# Patient Record
Sex: Female | Born: 1985 | Race: White | Hispanic: No | Marital: Married | Smoking: Never smoker
Health system: Southern US, Community
[De-identification: ages and names within clinical notes are randomized; demographics above are authoritative.]

## PROBLEM LIST (undated history)

## (undated) DIAGNOSIS — E559 Vitamin D deficiency, unspecified: Secondary | ICD-10-CM

## (undated) DIAGNOSIS — F32A Depression, unspecified: Secondary | ICD-10-CM

## (undated) DIAGNOSIS — F419 Anxiety disorder, unspecified: Secondary | ICD-10-CM

## (undated) DIAGNOSIS — F329 Major depressive disorder, single episode, unspecified: Secondary | ICD-10-CM

## (undated) DIAGNOSIS — E079 Disorder of thyroid, unspecified: Secondary | ICD-10-CM

## (undated) HISTORY — PX: NO PAST SURGERIES: SHX2092

## (undated) HISTORY — DX: Anxiety disorder, unspecified: F41.9

## (undated) HISTORY — DX: Major depressive disorder, single episode, unspecified: F32.9

## (undated) HISTORY — DX: Depression, unspecified: F32.A

---

## 2013-06-01 ENCOUNTER — Ambulatory Visit: Payer: Self-pay | Admitting: Emergency Medicine

## 2013-06-01 LAB — URINALYSIS, COMPLETE
BILIRUBIN, UR: NEGATIVE
GLUCOSE, UR: NEGATIVE mg/dL (ref 0–75)
Ketone: NEGATIVE
LEUKOCYTE ESTERASE: NEGATIVE
NITRITE: NEGATIVE
Ph: 7 (ref 4.5–8.0)
Specific Gravity: 1.01 (ref 1.003–1.030)
WBC UR: NONE SEEN /HPF (ref 0–5)

## 2013-06-01 LAB — RAPID INFLUENZA A&B ANTIGENS

## 2013-06-01 LAB — PREGNANCY, URINE: Pregnancy Test, Urine: NEGATIVE m[IU]/mL

## 2014-01-15 ENCOUNTER — Ambulatory Visit: Payer: Self-pay | Admitting: Obstetrics and Gynecology

## 2014-02-11 ENCOUNTER — Ambulatory Visit: Payer: Self-pay | Admitting: Obstetrics and Gynecology

## 2014-02-19 ENCOUNTER — Observation Stay: Payer: Self-pay | Admitting: Emergency Medicine

## 2014-02-19 LAB — TROPONIN I: Troponin-I: <0.02 ng/mL

## 2014-02-19 LAB — COMPREHENSIVE METABOLIC PANEL
ALBUMIN: 2.5 g/dL — AB (ref 3.4–5.0)
ALT: 16 U/L
Alkaline Phosphatase: 193 U/L — ABNORMAL HIGH
Anion Gap: 9 (ref 7–16)
BILIRUBIN TOTAL: 0.3 mg/dL (ref 0.2–1.0)
BUN: 9 mg/dL (ref 7–18)
CALCIUM: 8.6 mg/dL (ref 8.5–10.1)
Chloride: 107 mmol/L (ref 98–107)
Co2: 23 mmol/L (ref 21–32)
Creatinine: 0.5 mg/dL — ABNORMAL LOW (ref 0.60–1.30)
EGFR (African American): >60
GLUCOSE: 80 mg/dL (ref 65–99)
OSMOLALITY: 275 (ref 275–301)
Potassium: 4 mmol/L (ref 3.5–5.1)
SGOT(AST): 15 U/L (ref 15–37)
Sodium: 139 mmol/L (ref 136–145)
Total Protein: 6.5 g/dL (ref 6.4–8.2)

## 2014-02-19 LAB — CBC
HCT: 38.2 % (ref 35.0–47.0)
HGB: 12.7 g/dL (ref 12.0–16.0)
MCH: 29 pg (ref 26.0–34.0)
MCHC: 33.2 g/dL (ref 32.0–36.0)
MCV: 87 fL (ref 80–100)
Platelet: 289 10*3/uL (ref 150–440)
RBC: 4.37 10*6/uL (ref 3.80–5.20)
RDW: 13.9 % (ref 11.5–14.5)
WBC: 9.9 10*3/uL (ref 3.6–11.0)

## 2014-03-12 ENCOUNTER — Inpatient Hospital Stay: Payer: Self-pay

## 2014-03-12 LAB — CBC WITH DIFFERENTIAL/PLATELET
BASOS ABS: 0 10*3/uL (ref 0.0–0.1)
Basophil %: 0.4 %
Eosinophil #: 0.2 10*3/uL (ref 0.0–0.7)
Eosinophil %: 2.2 %
HCT: 38.3 % (ref 35.0–47.0)
HGB: 12.6 g/dL (ref 12.0–16.0)
LYMPHS ABS: 1.7 10*3/uL (ref 1.0–3.6)
Lymphocyte %: 18.1 %
MCH: 28.3 pg (ref 26.0–34.0)
MCHC: 32.8 g/dL (ref 32.0–36.0)
MCV: 86 fL (ref 80–100)
Monocyte #: 0.6 x10 3/mm (ref 0.2–0.9)
Monocyte %: 6.1 %
Neutrophil #: 7 10*3/uL — ABNORMAL HIGH (ref 1.4–6.5)
Neutrophil %: 73.2 %
Platelet: 296 10*3/uL (ref 150–440)
RBC: 4.45 10*6/uL (ref 3.80–5.20)
RDW: 14.3 % (ref 11.5–14.5)
WBC: 9.5 10*3/uL (ref 3.6–11.0)

## 2014-03-13 DIAGNOSIS — O24419 Gestational diabetes mellitus in pregnancy, unspecified control: Secondary | ICD-10-CM

## 2014-03-13 LAB — CBC WITH DIFFERENTIAL/PLATELET
BASOS PCT: 0.2 %
Basophil #: 0 10*3/uL (ref 0.0–0.1)
Eosinophil #: 0.1 10*3/uL (ref 0.0–0.7)
Eosinophil %: 1 %
HCT: 38.5 % (ref 35.0–47.0)
HGB: 12.4 g/dL (ref 12.0–16.0)
Lymphocyte #: 1.8 10*3/uL (ref 1.0–3.6)
Lymphocyte %: 13.7 %
MCH: 27.7 pg (ref 26.0–34.0)
MCHC: 32.1 g/dL (ref 32.0–36.0)
MCV: 86 fL (ref 80–100)
MONO ABS: 0.8 x10 3/mm (ref 0.2–0.9)
Monocyte %: 6.6 %
Neutrophil #: 10.1 10*3/uL — ABNORMAL HIGH (ref 1.4–6.5)
Neutrophil %: 78.5 %
Platelet: 285 10*3/uL (ref 150–440)
RBC: 4.46 10*6/uL (ref 3.80–5.20)
RDW: 14.5 % (ref 11.5–14.5)
WBC: 12.8 10*3/uL — ABNORMAL HIGH (ref 3.6–11.0)

## 2014-03-15 LAB — HEMATOCRIT: HCT: 31.3 % — ABNORMAL LOW (ref 35.0–47.0)

## 2014-08-09 ENCOUNTER — Ambulatory Visit
Admit: 2014-08-09 | Disposition: A | Discharge status: Home / Self Care | Payer: Self-pay | Attending: Family Medicine | Admitting: Family Medicine

## 2014-08-09 LAB — RAPID STREP-A WITH REFLX: MICRO TEXT REPORT: NEGATIVE

## 2014-08-12 LAB — BETA STREP CULTURE(ARMC)

## 2014-08-21 NOTE — H&P (Signed)
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   Patient's Medical History Thyroid Disease   Patient's Surgical History none   Medications Pre Natal Vitamins  Tylenol (Acetaminophen)  levothyroxine 50mcg, vit D 2000 international unit   Allergies Sulfa, other, Ceclor   Social History none   Family History Non-Contributory   ROS:  ROS All systems were reviewed.  HEENT, CNS, GI, GU, Respiratory, CV, Renal and Musculoskeletal systems were found to be normal.   Exam:  Vital Signs stable   Urine Protein not completed   General no apparent distress   Mental Status clear   Chest clear   Heart normal sinus rhythm   Abdomen gravid, non-tender   Estimated Fetal Weight Average for gestational age   Fetal Position vtx   Back no CVAT   Edema no edema   Pelvic ft/50/-2   Mebranes Intact   FHT normal rate with no decels   Fetal Heart Rate 145   Ucx absent   Skin dry   Impression:  Impression IOL for GDM at 8129w4d   Plan:  Plan EFM/NST, Cytotec IOL at term   Electronic Signatures: Ulyses AmorBurr, Rinda Rollyson N (CNM)  (Signed [phone]-Nov-15 08:56)  Authored: L&D Evaluation   Last Updated: 30-Nov-15 08:56 by Ulyses AmorBurr, Lilas Diefendorf N (CNM)

## 2015-04-17 ENCOUNTER — Encounter: Payer: Self-pay | Admitting: Gynecology

## 2015-04-17 ENCOUNTER — Ambulatory Visit
Admission: EM | Admit: 2015-04-17 | Discharge: 2015-04-17 | Disposition: A | Discharge status: Home / Self Care | Attending: Family Medicine | Admitting: Family Medicine

## 2015-04-17 DIAGNOSIS — L309 Dermatitis, unspecified: Secondary | ICD-10-CM | POA: Diagnosis not present

## 2015-04-17 HISTORY — DX: Disorder of thyroid, unspecified: E07.9

## 2015-04-17 HISTORY — DX: Vitamin D deficiency, unspecified: E55.9

## 2015-04-17 MED ORDER — TRIAMCINOLONE ACETONIDE 0.1 % EX CREA: 1.0000 "application " | TOPICAL_CREAM | Freq: Two times a day (BID) | CUTANEOUS | Status: DC

## 2015-04-17 NOTE — ED Provider Notes (Signed)
CSN: [id number]     Arrival date & time 04/17/15  1129 History   First MD Initiated Contact with Patient 04/17/15 1309     Chief Complaint  Patient presents with  . Poison Ivy   (Consider location/radiation/quality/duration/timing/severity/associated sxs/prior Treatment) HPI Comments: 30 yo female presents with a 2 weeks h/o a itchy rash on her legs and arms. Denies any pain, burning, fevers, chills, swelling, chest pains, shortness of breath,  blisters, drainage, new soaps, detergents, lotions, cosmetics, medications, pets, plants. States last summer had an exposure to poison ivy, developed a poison ivy rash, and wondering if this is related.  The history is provided by the patient.    Past Medical History  Diagnosis Date  . Thyroid disease   . Vitamin D deficiency    History reviewed. No pertinent past surgical history. No family history on file. Social History  Substance Use Topics  . Smoking status: Never Smoker   . Smokeless tobacco: None  . Alcohol Use: Yes   OB History    No data available     Review of Systems  Allergies  Ceclor and Sulfa antibiotics  Home Medications   Prior to Admission medications   Medication Sig Start Date End Date Taking? Authorizing Provider  Cholecalciferol (VITAMIN D) 2000 units tablet Take 2,000 Units by mouth daily.   Yes Historical Provider, MD  levothyroxine (SYNTHROID, LEVOTHROID) 50 MCG tablet Take 50 mcg by mouth daily before breakfast.   Yes Historical Provider, MD  norgestimate-ethinyl estradiol (ORTHO-CYCLEN,SPRINTEC,PREVIFEM) 0.25-35 MG-MCG tablet Take 1 tablet by mouth daily.   Yes Historical Provider, MD  triamcinolone cream (KENALOG) 0.1 % Apply 1 application topically 2 (two) times daily. 04/17/15   Payton Mccallumrlando Jaymen Fetch, MD   Meds Ordered and Administered this Visit  Medications - No data to display  BP 110/65 mmHg  Pulse 70  Temp(Src) 98.1 F (36.7 C) (Oral)  Resp 16  Ht 5\' 5"  (1.651 m)  Wt 174 lb (78.926 kg)  BMI 28.96  kg/m2  SpO2 100%  LMP 02/15/2015 (Approximate) No data found.   Physical Exam  Constitutional: She appears well-developed and well-nourished. No distress.  Skin: Rash noted. She is not diaphoretic.  Scaly, erythematous rash to legs and arms, mainly flexural areas; no vesicular lesions or drainage  Nursing note and vitals reviewed.   ED Course  Procedures (including critical care time)  Labs Review Labs Reviewed - No data to display  Imaging Review No results found.   Visual Acuity Review  Right Eye Distance:   Left Eye Distance:   Bilateral Distance:    Right Eye Near:   Left Eye Near:    Bilateral Near:         MDM   1. Eczema    Discharge Medication List as of 04/17/2015  1:36 PM    START taking these medications   Details  triamcinolone cream (KENALOG) 0.1 % Apply 1 application topically 2 (two) times daily., Starting 04/17/2015, Until Discontinued, Normal       1. diagnosis reviewed with patient 2. rx as per orders above; reviewed possible side effects, interactions, risks and benefits  3. Recommend supportive treatment with increased water intake, moisturizing lotion 4. Follow-up prn if symptoms worsen or don't improve    Payton Mccallumrlando Jerral Mccauley, MD 05/08/15 1332

## 2015-04-17 NOTE — ED Notes (Signed)
Patient c/o rash bilateral arms and legs x 2 weeks.

## 2015-07-29 ENCOUNTER — Encounter: Payer: Self-pay | Admitting: *Deleted

## 2015-07-30 ENCOUNTER — Other Ambulatory Visit: Payer: Self-pay | Admitting: Obstetrics and Gynecology

## 2015-07-30 ENCOUNTER — Encounter: Payer: Self-pay | Admitting: Obstetrics and Gynecology

## 2015-07-30 ENCOUNTER — Ambulatory Visit (INDEPENDENT_AMBULATORY_CARE_PROVIDER_SITE_OTHER): Admitting: Obstetrics and Gynecology

## 2015-07-30 VITALS — BP 115/69 | HR 82 | Ht 65.0 in | Wt 180.9 lb

## 2015-07-30 DIAGNOSIS — Z01419 Encounter for gynecological examination (general) (routine) without abnormal findings: Secondary | ICD-10-CM

## 2015-07-30 DIAGNOSIS — E669 Obesity, unspecified: Secondary | ICD-10-CM | POA: Diagnosis not present

## 2015-07-30 NOTE — Progress Notes (Signed)
Subjective:   Daisy Hancock is a 30 y.o. 702P0011 Caucasian female here for a routine well-woman exam.  No LMP recorded. Patient is not currently having periods (Reason: Oral contraceptives).    Current complaints: inability to lose weight PCP: me       does desire labs  Social History: Sexual: heterosexual Marital Status: married Living situation: with family Occupation: homemaker Tobacco/alcohol: no tobacco use Illicit drugs: no history of illicit drug use  The following portions of the patient's history were reviewed and updated as appropriate: allergies, current medications, past family history, past medical history, past social history, past surgical history and problem list.  Past Medical History Past Medical History  Diagnosis Date  . Thyroid disease   . Vitamin D deficiency   . Anxiety   . Depression     Past Surgical History History reviewed. No pertinent past surgical history.  Gynecologic History G2P0011  No LMP recorded. Patient is not currently having periods (Reason: Oral contraceptives). Contraception: OCP (estrogen/progesterone) Last Pap: 2014. Results were: normal   Obstetric History OB History  Gravida Para Term Preterm AB SAB TAB Ectopic Multiple Living  2    1 1    1     # Outcome Date GA Lbr Len/2nd Weight Sex Delivery Anes PTL Lv  2 Gravida 03/13/14    F Vag-Spont   Y  1 SAB               Current Medications Current Outpatient Prescriptions on File Prior to Visit  Medication Sig Dispense Refill  . Cholecalciferol (VITAMIN D) 2000 units tablet Take 2,000 Units by mouth daily.    Marland Kitchen. levothyroxine (SYNTHROID, LEVOTHROID) 50 MCG tablet Take 50 mcg by mouth daily before breakfast.    . norgestimate-ethinyl estradiol (ORTHO-CYCLEN,SPRINTEC,PREVIFEM) 0.25-35 MG-MCG tablet Take 1 tablet by mouth daily.    Marland Kitchen. triamcinolone cream (KENALOG) 0.1 % Apply 1 application topically 2 (two) times daily. (Patient not taking: Reported on 07/30/2015) 30 g 0    No current facility-administered medications on file prior to visit.    Review of Systems Patient denies any headaches, blurred vision, shortness of breath, chest pain, abdominal pain, problems with bowel movements, urination, or intercourse.  Objective:  BP 115/69 mmHg  Pulse 82  Ht 5\' 5"  (1.651 m)  Wt 180 lb 14.4 oz (82.056 kg)  BMI 30.10 kg/m2 Physical Exam  General:  Well developed, well nourished, no acute distress. She is alert and oriented x3. Skin:  Warm and dry Neck:  Midline trachea, no thyromegaly or nodules Cardiovascular: Regular rate and rhythm, no murmur heard Lungs:  Effort normal, all lung fields clear to auscultation bilaterally Breasts:  No dominant palpable mass, retraction, or nipple discharge Abdomen:  Soft, non tender, no hepatosplenomegaly or masses Pelvic:  External genitalia is normal in appearance.  The vagina is normal in appearance. The cervix is bulbous, no CMT.  Thin prep pap is done with HR HPV cotesting. Uterus is felt to be normal size, shape, and contour.  No adnexal masses or tenderness noted. Extremities:  No swelling or varicosities noted Psych:  She has a normal mood and affect  Assessment:   Healthy well-woman exam Obesity OCP user  Plan:   F/U 1 year for AE, or sooner if needed  Colie Josten Suzan NailerN Kelani Robart, CNM

## 2015-07-30 NOTE — Patient Instructions (Signed)
  Place annual gynecologic exam patient instructions here.  Thank you for enrolling in MyChart. Please follow the instructions below to securely access your online medical record. MyChart allows you to send messages to your doctor, view your test results, manage appointments, and more.   How Do I Sign Up? 1. In your Internet browser, go to [employer] Address [employer] and enter [email or web address]. 2. Click on the Sign Up Now link in the Sign In box. You will see the New Member Sign Up page. 3. Enter your MyChart Access Code exactly as it appears below. You will not need to use this code after you've completed the sign-up process. If you do not sign up before the expiration date, you must request a new code.  MyChart Access Code: [email or web address] Expires: 09/28/2015  2:04 PM  4. Enter your Social Security Number ([id number]) and Date of Birth (mm/dd/yyyy) as indicated and click Submit. You will be taken to the next sign-up page. 5. Create a MyChart ID. This will be your MyChart login ID and cannot be changed, so think of one that is secure and easy to remember. 6. Create a MyChart password. You can change your password at any time. 7. Enter your Password Reset Question and Answer. This can be used at a later time if you forget your password.  8. Enter your e-mail address. You will receive e-mail notification when new information is available in MyChart. 9. Click Sign Up. You can now view your medical record.   Additional Information Remember, MyChart is NOT to be used for urgent needs. For medical emergencies, dial 911.

## 2015-07-31 LAB — COMPREHENSIVE METABOLIC PANEL WITH GFR
ALT: 26 IU/L (ref 0–32)
AST: 15 IU/L (ref 0–40)
Albumin/Globulin Ratio: 1.8 (ref 1.2–2.2)
Albumin: 4.4 g/dL (ref 3.5–5.5)
Alkaline Phosphatase: 57 IU/L (ref 39–117)
BUN/Creatinine Ratio: 16 (ref 9–23)
BUN: 11 mg/dL (ref 6–20)
Bilirubin Total: 0.2 mg/dL (ref 0.0–1.2)
CO2: 22 mmol/L (ref 18–29)
Calcium: 9 mg/dL (ref 8.7–10.2)
Chloride: 100 mmol/L (ref 96–106)
Creatinine, Ser: 0.67 mg/dL (ref 0.57–1.00)
GFR calc Af Amer: 136 mL/min/1.73
GFR calc non Af Amer: 118 mL/min/1.73
Globulin, Total: 2.5 g/dL (ref 1.5–4.5)
Glucose: 79 mg/dL (ref 65–99)
Potassium: 4.1 mmol/L (ref 3.5–5.2)
Sodium: 140 mmol/L (ref 134–144)
Total Protein: 6.9 g/dL (ref 6.0–8.5)

## 2015-07-31 LAB — LIPID PANEL
CHOL/HDL RATIO: 3.7 ratio (ref 0.0–4.4)
Cholesterol, Total: 122 mg/dL (ref 100–199)
HDL: 33 mg/dL — ABNORMAL LOW (ref >39)
LDL Calculated: 48 mg/dL (ref 0–99)
Triglycerides: 205 mg/dL — ABNORMAL HIGH (ref 0–149)
VLDL CHOLESTEROL CAL: 41 mg/dL — AB (ref 5–40)

## 2015-07-31 LAB — THYROID PANEL WITH TSH
Free Thyroxine Index: 3 (ref 1.2–4.9)
T3 Uptake Ratio: 23 % — ABNORMAL LOW (ref 24–39)
T4, Total: 13.1 ug/dL — ABNORMAL HIGH (ref 4.5–12.0)
TSH: 1.39 u[IU]/mL (ref 0.450–4.500)

## 2015-07-31 LAB — CYTOLOGY - PAP

## 2015-08-08 ENCOUNTER — Ambulatory Visit (INDEPENDENT_AMBULATORY_CARE_PROVIDER_SITE_OTHER): Admitting: Obstetrics and Gynecology

## 2015-08-08 VITALS — BP 113/75 | HR 93 | Wt 178.0 lb

## 2015-08-08 DIAGNOSIS — E669 Obesity, unspecified: Secondary | ICD-10-CM | POA: Diagnosis not present

## 2015-08-08 MED ORDER — CYANOCOBALAMIN 1000 MCG/ML IJ SOLN
1000.0000 ug | Freq: Once | INTRAMUSCULAR | Status: AC
  Administered 2015-08-08: 1000 ug via INTRAMUSCULAR

## 2015-08-08 NOTE — Progress Notes (Signed)
Patient ID: Daisy Hancock, female   DOB: 10/01/1985, 30 y.o.   MRN: [id number] Pt presents for weight, B/P, B-12 injection. Pt picked up prescription today for phentermine and B-12 medication. Wt. 178 lbs.

## 2015-08-09 ENCOUNTER — Telehealth: Payer: Self-pay | Admitting: *Deleted

## 2015-08-09 NOTE — Telephone Encounter (Signed)
Mailed all info to pt 

## 2015-08-09 NOTE — Telephone Encounter (Signed)
-----   Message from Purcell NailsMelody N Shambley, [place] sent at 08/06/2015  3:40 PM EDT ----- Pleas let her know labs look stable, to continue to work on low cholesterol diet. And we will recheck in 1 year

## 2015-09-05 ENCOUNTER — Ambulatory Visit (INDEPENDENT_AMBULATORY_CARE_PROVIDER_SITE_OTHER): Admitting: Obstetrics and Gynecology

## 2015-09-05 VITALS — BP 99/63 | HR 81 | Ht 65.0 in | Wt 174.6 lb

## 2015-09-05 DIAGNOSIS — E663 Overweight: Secondary | ICD-10-CM | POA: Diagnosis not present

## 2015-09-05 DIAGNOSIS — R5383 Other fatigue: Secondary | ICD-10-CM | POA: Diagnosis not present

## 2015-09-05 MED ORDER — CYANOCOBALAMIN 1000 MCG/ML IJ SOLN
1000.0000 ug | Freq: Once | INTRAMUSCULAR | Status: AC
  Administered 2015-09-05: 1000 ug via INTRAMUSCULAR

## 2015-09-05 NOTE — Progress Notes (Signed)
Filed Weights   09/05/15 1449  Weight: 174 lb 9.6 oz (79.198 kg)     Pt presents for weight management, pt today with 4lbs weight loss since visit last month. Pt denies adverse reaction to Adipex. Gave pt verbal encouragement. Pt to f/u in 1 month.

## 2015-10-02 ENCOUNTER — Ambulatory Visit (INDEPENDENT_AMBULATORY_CARE_PROVIDER_SITE_OTHER): Admitting: Obstetrics and Gynecology

## 2015-10-02 VITALS — BP 110/67 | HR 85 | Wt 167.6 lb

## 2015-10-02 DIAGNOSIS — E669 Obesity, unspecified: Secondary | ICD-10-CM | POA: Diagnosis not present

## 2015-10-02 MED ORDER — CYANOCOBALAMIN 1000 MCG/ML IJ SOLN
1000.0000 ug | Freq: Once | INTRAMUSCULAR | Status: AC
  Administered 2015-10-02: 1000 ug via INTRAMUSCULAR

## 2015-10-02 NOTE — Progress Notes (Signed)
Patient ID: Daisy Hancock, female   DOB: 10/24/1985, 30 y.o.   MRN: [id number] Pt presents for weight, B/P, B-12 injection. No side effects of medication-Phentermine, or B-12.  Weight loss of 7 lbs. Encouraged eating healthy and exercise.

## 2015-10-03 ENCOUNTER — Ambulatory Visit

## 2015-10-08 ENCOUNTER — Ambulatory Visit (INDEPENDENT_AMBULATORY_CARE_PROVIDER_SITE_OTHER): Admitting: Obstetrics and Gynecology

## 2015-10-08 ENCOUNTER — Encounter: Payer: Self-pay | Admitting: Obstetrics and Gynecology

## 2015-10-08 VITALS — BP 121/82 | HR 102 | Ht 65.0 in | Wt 165.6 lb

## 2015-10-08 DIAGNOSIS — N921 Excessive and frequent menstruation with irregular cycle: Secondary | ICD-10-CM

## 2015-10-08 DIAGNOSIS — N926 Irregular menstruation, unspecified: Secondary | ICD-10-CM

## 2015-10-08 LAB — POCT URINE PREGNANCY: PREG TEST UR: NEGATIVE

## 2015-10-08 NOTE — Progress Notes (Signed)
Subjective:     Patient ID: Daisy Hancock, female   DOB: 04/06/1986, 30 y.o.   MRN: [id number]  HPI Reports onset of spotting in middle of pack of of OCPs for 3 days, then became heavy with cramping and mucus/tissue in with blood times 7 days. Resolved and then menses started as normal in placebo week. Denies forgetting or taking any pills late, or any other symptoms. Has lost 23# in last 10 weeks on adipex and B12.  Review of Systems See above    Objective:   Physical Exam A&O x4  well groomed female in no distress Blood pressure 121/82, pulse 102, height 5\' 5"  (1.651 m), weight 165 lb 9.6 oz (75.116 kg), last menstrual period 10/07/2015, not currently breastfeeding. UPT- Pelvic exam: normal external genitalia, vulva, vagina, cervix, uterus and adnexa.    Assessment:     BTB on OCP, secondary to rapid weight loss     Plan:     Will continue with current medications, OK to double up on pills for 3 days if bleeding reoccurs this month or in future. Patient reassured.  Keilany Burnette BenldShambley, CNM

## 2015-11-06 ENCOUNTER — Encounter: Payer: Self-pay | Admitting: Obstetrics and Gynecology

## 2015-11-06 ENCOUNTER — Ambulatory Visit (INDEPENDENT_AMBULATORY_CARE_PROVIDER_SITE_OTHER): Admitting: Obstetrics and Gynecology

## 2015-11-06 VITALS — BP 114/72 | HR 84 | Wt 159.2 lb

## 2015-11-06 DIAGNOSIS — E669 Obesity, unspecified: Secondary | ICD-10-CM

## 2015-11-06 MED ORDER — PHENTERMINE HCL 37.5 MG PO TABS: ORAL_TABLET | ORAL | 2 refills | Status: DC

## 2015-11-06 MED ORDER — CYANOCOBALAMIN 1000 MCG/ML IJ SOLN
1000.0000 ug | Freq: Once | INTRAMUSCULAR | Status: AC
  Administered 2015-11-06: 1000 ug via INTRAMUSCULAR

## 2015-11-06 NOTE — Progress Notes (Signed)
Patient ID: Daisy Hancock, female   DOB: 1985/11/02, 30 y.o.   MRN: [id number] Pt is here for weight management, she is doing very well, denies any s/e Desires refill of Phentermine   08/08/15 wt-178lb 09/05/15 wt-174lb 10/02/15 wt-167lb   Wt-159.2lb BP-114 72 P-84

## 2015-12-18 ENCOUNTER — Ambulatory Visit (INDEPENDENT_AMBULATORY_CARE_PROVIDER_SITE_OTHER): Admitting: Obstetrics and Gynecology

## 2015-12-18 ENCOUNTER — Ambulatory Visit

## 2015-12-18 VITALS — BP 106/71 | HR 94 | Wt 151.5 lb

## 2015-12-18 DIAGNOSIS — E669 Obesity, unspecified: Secondary | ICD-10-CM | POA: Diagnosis not present

## 2015-12-18 MED ORDER — CYANOCOBALAMIN 1000 MCG/ML IJ SOLN
1000.0000 ug | Freq: Once | INTRAMUSCULAR | Status: AC
  Administered 2015-12-18: 1000 ug via INTRAMUSCULAR

## 2015-12-18 NOTE — Progress Notes (Signed)
Patient ID: Daisy Hancock, female   DOB: 03/17/1986, 30 y.o.   MRN: [id number] Pt presents for weight, B/P, B-12 injection. No side effects of medication-Phentermine, or B-12.  Weight loss of _8_ lbs. Encouraged eating healthy and exercise.

## 2016-01-15 ENCOUNTER — Ambulatory Visit (INDEPENDENT_AMBULATORY_CARE_PROVIDER_SITE_OTHER): Admitting: Obstetrics and Gynecology

## 2016-01-15 VITALS — BP 91/72 | HR 105 | Wt 151.0 lb

## 2016-01-15 DIAGNOSIS — E663 Overweight: Secondary | ICD-10-CM

## 2016-01-15 MED ORDER — CYANOCOBALAMIN 1000 MCG/ML IJ SOLN
1000.0000 ug | Freq: Once | INTRAMUSCULAR | Status: AC
  Administered 2016-01-15: 1000 ug via INTRAMUSCULAR

## 2016-01-15 NOTE — Progress Notes (Signed)
Pt is here for wt, bp check, b-1 2 inj Pt denies any s/e, pt states her family has moved in with her parents, sold there house,[place] moving to [place].   12/18/15 wt- 151lb 11/06/15 wt- 159lb

## 2016-01-21 ENCOUNTER — Telehealth: Payer: Self-pay | Admitting: Obstetrics and Gynecology

## 2016-01-21 NOTE — Telephone Encounter (Signed)
Have her put on nurse schedule, as she will will need to fill out forms.

## 2016-01-21 NOTE — Telephone Encounter (Signed)
Pt just found out that her mother has breast ca, she wants to come in for the genetic blood test for breast ca. Let me know if she can come do that or do you need to see her?  She is moving to [place] in a few weeks and would like to do this before she goes. Let me know what I  Need to do and I'll call her.

## 2016-01-24 ENCOUNTER — Other Ambulatory Visit

## 2016-02-06 ENCOUNTER — Telehealth: Payer: Self-pay | Admitting: Obstetrics and Gynecology

## 2016-02-06 NOTE — Telephone Encounter (Signed)
Pt called and stated that she received a message from you wanting her to call you back, she was returning your call.

## 2016-02-06 NOTE — Telephone Encounter (Signed)
Pt aware that insurance will not cover BRAC and wanted to know how much this cost. I informed pt that I would have the  rep. Contact her if she would like and pt agreed that she would like to talk with her. Will talk with rep next week. [place].

## 2016-02-11 ENCOUNTER — Ambulatory Visit (INDEPENDENT_AMBULATORY_CARE_PROVIDER_SITE_OTHER): Admitting: Obstetrics and Gynecology

## 2016-02-11 VITALS — BP 110/73 | HR 80 | Ht 65.0 in | Wt 150.7 lb

## 2016-02-11 DIAGNOSIS — E663 Overweight: Secondary | ICD-10-CM

## 2016-02-11 MED ORDER — CYANOCOBALAMIN 1000 MCG/ML IJ SOLN
1000.0000 ug | Freq: Once | INTRAMUSCULAR | Status: AC
  Administered 2016-02-11: 1000 ug via INTRAMUSCULAR

## 2016-02-11 NOTE — Progress Notes (Signed)
Patient ID: Daisy Hancock, female   DOB: 07/28/1985, 30 y.o.   MRN: [id number]  Pt presents for weight, B/P, B-12 injection. No side effects of medication-Phentermine, or B-12.  Weight loss/gain of  1  lbs. Encouraged eating healthy and exercise. Pt moving to [place]. No f/u appts made.

## 2016-02-12 ENCOUNTER — Telehealth: Payer: Self-pay

## 2016-02-12 NOTE — Telephone Encounter (Signed)
I spoke with pt and informed her that I quoted her the wrong cost for the BRCA test. Instead of $350, the special offer cost $795. Total without special cost is $2400. I informed her that when bill is received [facility] will work with her to decrease bill or set up payments so this is possible for pt.  Pt will talk with her husband and call me back tomorrow and let me know what she would like to do.

## 2016-02-17 ENCOUNTER — Telehealth: Payer: Self-pay | Admitting: Obstetrics and Gynecology

## 2016-02-17 NOTE — Telephone Encounter (Signed)
Called pt fixed the rx

## 2016-02-17 NOTE — Telephone Encounter (Signed)
Called and fixed medication

## 2016-02-17 NOTE — Telephone Encounter (Signed)
Patient called stating the [facility] in [place] wont accept the hand written script for phentermine. She needs you to call the pharmacy the number to [facility] is [phone].

## 2016-02-24 NOTE — Telephone Encounter (Signed)
[facility] CALLED AND THEY NEED CLARIFICATION ON A RX THE MNS SENT IN. NEED THE BRAND NAME, THEY CAN NOT PROCESS IT, PLEASE CALL THE ABOVE NUMBER AN D REFER TO THE REFF # LISTED IN COMMENT NOTE.

## 2016-02-26 NOTE — Telephone Encounter (Signed)
Done-ac 

## 2016-03-03 ENCOUNTER — Other Ambulatory Visit: Payer: Self-pay | Admitting: *Deleted

## 2016-03-03 ENCOUNTER — Telehealth: Payer: Self-pay | Admitting: Obstetrics and Gynecology

## 2016-03-03 MED ORDER — NORGESTIMATE-ETH ESTRADIOL 0.25-35 MG-MCG PO TABS: 1.0000 | ORAL_TABLET | Freq: Every day | ORAL | 4 refills | Status: DC

## 2016-03-03 MED ORDER — NORGESTIMATE-ETH ESTRADIOL 0.25-35 MG-MCG PO TABS: 1.0000 | ORAL_TABLET | Freq: Every day | ORAL | 0 refills | Status: DC

## 2016-03-03 NOTE — Telephone Encounter (Signed)
Daisy Hancock says heyyyy!!!! [place]oved to [place]....Daisy Hancock cannot take the generic Sprintec and the box dispense as written was not checked and they are trying to give her the generic at ([facility]) so it needs to be corrected and the box checked dispense as written. She needs 1 month of Sprintech (not the generic) to local pharmacy.[name]. [facility] in [place] phone number [phone])

## 2016-03-11 NOTE — Telephone Encounter (Signed)
Done-ac 

## 2016-05-04 ENCOUNTER — Other Ambulatory Visit: Payer: Self-pay | Admitting: Obstetrics and Gynecology

## 2016-05-29 ENCOUNTER — Encounter: Payer: Self-pay | Admitting: Obstetrics and Gynecology

## 2017-04-13 NOTE — L&D Delivery Note (Signed)
Delivery Note  0403 In room to see patient, patient actively pushing. Fetal head crowning. Tillman Sers, CNM at bedside and relieved of duty.   0405 Spontaneous vaginal birth of liveborn female patient left occiput anterior position, loose nuchal cord x 1 reduced on perineum. Shoulder delivered with McRoberts and suprapubic pressure after second attempt. Infant immediately to maternal abdomen, delayed cord clamping, three (3) vessel cord. APGARS: 6, 9. Weight: 4260 g (9 lbs 6 oz). Receiving nurse at bedside for birth. Infant taken to warmer for further evaluation.   IM pitocin given, see MAR. 0409 Spontaneous delivery of intact placenta. First degree perineal laceration repaired under local anesthesia with 3-0 Vicryl rapide. Laceration well approximated and hemostatic. Uterus firm. Lochia small. QBL: 605 ml. Vault check completed. Counts correct x 2.   Initiate routine postpartum care and orders. Mom to postpartum.  Baby to Couplet care / Skin to Skin.   Gunnar Bulla, CNM [facility], [facility] 12/16/2017, 4:39 AM

## 2017-04-26 ENCOUNTER — Other Ambulatory Visit: Payer: Self-pay | Admitting: Obstetrics and Gynecology

## 2017-05-04 LAB — OB RESULTS CONSOLE HGB/HCT, BLOOD
HEMATOCRIT: 42
Hemoglobin: 14.4

## 2017-05-04 LAB — OB RESULTS CONSOLE ANTIBODY SCREEN: Antibody Screen: NEGATIVE

## 2017-05-04 LAB — OB RESULTS CONSOLE ABO/RH: RH TYPE: POSITIVE

## 2017-05-05 LAB — OB RESULTS CONSOLE RPR: RPR: NONREACTIVE

## 2017-05-05 LAB — OB RESULTS CONSOLE GC/CHLAMYDIA
Chlamydia: NEGATIVE
GC PROBE AMP, GENITAL: NEGATIVE

## 2017-05-05 LAB — OB RESULTS CONSOLE PLATELET COUNT: Platelets: 330

## 2017-05-05 LAB — OB RESULTS CONSOLE HIV ANTIBODY (ROUTINE TESTING): HIV: NONREACTIVE

## 2017-07-27 ENCOUNTER — Other Ambulatory Visit (INDEPENDENT_AMBULATORY_CARE_PROVIDER_SITE_OTHER)

## 2017-07-27 ENCOUNTER — Other Ambulatory Visit

## 2017-07-27 ENCOUNTER — Ambulatory Visit (INDEPENDENT_AMBULATORY_CARE_PROVIDER_SITE_OTHER): Admitting: Obstetrics and Gynecology

## 2017-07-27 ENCOUNTER — Other Ambulatory Visit: Payer: Self-pay | Admitting: Obstetrics and Gynecology

## 2017-07-27 ENCOUNTER — Encounter: Payer: Self-pay | Admitting: Obstetrics and Gynecology

## 2017-07-27 VITALS — BP 114/66 | HR 95 | Wt 180.8 lb

## 2017-07-27 DIAGNOSIS — Z3689 Encounter for other specified antenatal screening: Secondary | ICD-10-CM

## 2017-07-27 DIAGNOSIS — Z3492 Encounter for supervision of normal pregnancy, unspecified, second trimester: Secondary | ICD-10-CM | POA: Diagnosis not present

## 2017-07-27 LAB — POCT URINALYSIS DIPSTICK
Bilirubin, UA: NEGATIVE
Blood, UA: NEGATIVE
Glucose, UA: NEGATIVE
Ketones, UA: NEGATIVE
Leukocytes, UA: NEGATIVE
NITRITE UA: NEGATIVE
PH UA: 7 (ref 5.0–8.0)
Protein, UA: NEGATIVE
SPEC GRAV UA: 1.01 (ref 1.010–1.025)
UROBILINOGEN UA: 0.2 U/dL

## 2017-07-27 NOTE — Addendum Note (Signed)
Addended by: Rosine BeatLONTZ, Mumin Denomme L on: 07/27/2017 04:25 PM   Modules accepted: Orders

## 2017-07-27 NOTE — Progress Notes (Signed)
NOB- has records, anatomy scan done today, reveals: Indications: Anatomy Findings:  Singleton intrauterine pregnancy is visualized with FHR at 147 BPM. Biometrics give an (U/S) Gestational age of 33 6/7 weeks and an (U/S) EDD of 12/15/17; this correlates with the clinically established EDD of 12/15/17.  Fetal presentation is footling breech.  EFW: 321 grams (0lb 11oz). Placenta: Posterior and grade 1. AFI: WNL subjectively.  Anatomic survey is complete and appears WNL; Gender - [place].   Right Ovary measures 2.9 x 2.4 x 1.6 cm. It is normal in appearance. Left Ovary measures 2.8 x 2.2 x 2.1 cm. It is normal appearance. There is no obvious evidence of a corpus luteal cyst. Survey of the adnexa demonstrates no adnexal masses. There is no free peritoneal fluid in the cul de sac.  Impression: 1. 19 6/7 week Viable Singleton Intrauterine pregnancy by U/S. 2. (U/S) EDD is consistent with Clinically established (LMP) EDD of 12/15/17. 3. Normal Anatomy Scan

## 2017-07-27 NOTE — Progress Notes (Signed)
  NOB transfer from [place], pt is doing well, hasnt had anatomy scan done yet, doesn't want to know sex of the baby

## 2017-07-28 LAB — GLUCOSE, 1 HOUR GESTATIONAL: Gestational Diabetes Screen: 136 mg/dL (ref 65–139)

## 2017-08-09 ENCOUNTER — Encounter: Payer: Self-pay | Admitting: *Deleted

## 2017-08-25 ENCOUNTER — Ambulatory Visit (INDEPENDENT_AMBULATORY_CARE_PROVIDER_SITE_OTHER): Admitting: Obstetrics and Gynecology

## 2017-08-25 VITALS — BP 121/57 | HR 80 | Wt 185.9 lb

## 2017-08-25 DIAGNOSIS — Z3492 Encounter for supervision of normal pregnancy, unspecified, second trimester: Secondary | ICD-10-CM

## 2017-08-25 LAB — POCT URINALYSIS DIPSTICK
Bilirubin, UA: NEGATIVE
Blood, UA: NEGATIVE
Glucose, UA: NEGATIVE
Ketones, UA: NEGATIVE
LEUKOCYTES UA: NEGATIVE
NITRITE UA: NEGATIVE
PROTEIN UA: NEGATIVE
SPEC GRAV UA: 1.01 (ref 1.010–1.025)
Urobilinogen, UA: 0.2 E.U./dL
pH, UA: 6 (ref 5.0–8.0)

## 2017-08-25 NOTE — Progress Notes (Signed)
Rob- pt is doing well

## 2017-08-25 NOTE — Progress Notes (Signed)
ROB-doing well, reviewed anatomy scan -normal; discussed round ligament pain and getting maternity ban.

## 2017-09-23 ENCOUNTER — Other Ambulatory Visit

## 2017-09-23 ENCOUNTER — Ambulatory Visit (INDEPENDENT_AMBULATORY_CARE_PROVIDER_SITE_OTHER): Admitting: Certified Nurse Midwife

## 2017-09-23 ENCOUNTER — Encounter: Payer: Self-pay | Admitting: Certified Nurse Midwife

## 2017-09-23 VITALS — BP 95/62 | HR 92 | Wt 190.2 lb

## 2017-09-23 DIAGNOSIS — Z3492 Encounter for supervision of normal pregnancy, unspecified, second trimester: Secondary | ICD-10-CM | POA: Diagnosis not present

## 2017-09-23 DIAGNOSIS — Z3483 Encounter for supervision of other normal pregnancy, third trimester: Secondary | ICD-10-CM

## 2017-09-23 LAB — POCT URINALYSIS DIPSTICK
Bilirubin, UA: NEGATIVE
Blood, UA: NEGATIVE
Glucose, UA: NEGATIVE
Ketones, UA: NEGATIVE
Leukocytes, UA: NEGATIVE
NITRITE UA: NEGATIVE
PH UA: 6.5 (ref 5.0–8.0)
Protein, UA: NEGATIVE
SPEC GRAV UA: 1.01 (ref 1.010–1.025)
UROBILINOGEN UA: 0.2 U/dL

## 2017-09-23 MED ORDER — TETANUS-DIPHTH-ACELL PERTUSSIS 5-2.5-18.5 LF-MCG/0.5 IM SUSP
0.5000 mL | Freq: Once | INTRAMUSCULAR | Status: AC
  Administered 2017-09-23: 0.5 mL via INTRAMUSCULAR

## 2017-09-23 NOTE — Progress Notes (Signed)
Pt is here for an ROB visit. States she has had a decrease in fetal movement and a lot of pelvic pressure.

## 2017-09-23 NOTE — Progress Notes (Signed)
ROB-Reports significant decrease in fetal movement over the last 24 hours, no response to home treatment measures. TDaP given. Blood transfusion consent reviewed and signed. Glucola, CBC, and RPR today. Plans IV meds, breastfeeding and pill postpartum.Lactation consult for previous difficulties breastfeeding, see orders. NST performed today was reviewed and was found to be reactive. Baseline 135 bpm with Moderate variability; No decels noted. Anticipatory guidance regarding course of prenatal care. Childbirth class schedule and Volunteer doula pamphlet given. Reviewed red flag symptoms and when to call. RTC x 2 weeks for ROB or sooner if needed.

## 2017-09-23 NOTE — Patient Instructions (Addendum)
Abdominal Pain During Pregnancy Belly (abdominal) pain is common during pregnancy. Most of the time, it is not a serious problem. Other times, it can be a sign that something is wrong with the pregnancy. Always tell your doctor if you have belly pain. Follow these instructions at home: Monitor your belly pain for any changes. The following actions may help you feel better:  Do not have sex (intercourse) or put anything in your vagina until you feel better.  Rest until your pain stops.  Drink clear fluids if you feel sick to your stomach (nauseous). Do not eat solid food until you feel better.  Only take medicine as told by your doctor.  Keep all doctor visits as told.  Get help right away if:  You are bleeding, leaking fluid, or pieces of tissue come out of your vagina.  You have more pain or cramping.  You keep throwing up (vomiting).  You have pain when you pee (urinate) or have blood in your pee.  You have a fever.  You do not feel your baby moving as much.  You feel very weak or feel like passing out.  You have trouble breathing, with or without belly pain.  You have a very bad headache and belly pain.  You have fluid leaking from your vagina and belly pain.  You keep having watery poop (diarrhea).  Your belly pain does not go away after resting, or the pain gets worse. This information is not intended to replace advice given to you by your health care provider. Make sure you discuss any questions you have with your health care provider. Document Released: 03/18/2009 Document Revised: 11/06/2015 Document Reviewed: 10/27/2012 Elsevier Interactive Patient Education  2018 [employer]. Back Pain in Pregnancy Back pain during pregnancy is common. Back pain may be caused by several factors that are related to changes during your pregnancy. Follow these instructions at home: Managing pain, stiffness, and swelling  If directed, apply ice for sudden (acute) back  pain. ? Put ice in a plastic bag. ? Place a towel between your skin and the bag. ? Leave the ice on for 20 minutes, 2-3 times per day.  If directed, apply heat to the affected area before you exercise: ? Place a towel between your skin and the heat pack or heating pad. ? Leave the heat on for 20-30 minutes. ? Remove the heat if your skin turns bright red. This is especially important if you are unable to feel pain, heat, or cold. You may have a greater risk of getting burned. Activity  Exercise as told by your health care provider. Exercising is the best way to prevent or manage back pain.  Listen to your body when lifting. If lifting hurts, ask for help or bend your knees. This uses your leg muscles instead of your back muscles.  Squat down when picking up something from the floor. Do not bend over.  Only use bed rest as told by your health care provider. Bed rest should only be used for the most severe episodes of back pain. Standing, Sitting, and Lying Down  Do not stand in one place for long periods of time.  Use good posture when sitting. Make sure your head rests over your shoulders and is not hanging forward. Use a pillow on your lower back if necessary.  Try sleeping on your side, preferably the left side, with a pillow or two between your legs. If you are sore after a night's rest, your bed may  be too soft. A firm mattress may provide more support for your back during pregnancy. General instructions  Do not wear high heels.  Eat a healthy diet. Try to gain weight within your health care provider's recommendations.  Use a maternity girdle, elastic sling, or back brace as told by your health care provider.  Take over-the-counter and prescription medicines only as told by your health care provider.  Keep all follow-up visits as told by your health care provider. This is important. This includes any visits with any specialists, such as a physical therapist. Contact a health  care provider if:  Your back pain interferes with your daily activities.  You have increasing pain in other parts of your body. Get help right away if:  You develop numbness, tingling, weakness, or problems with the use of your arms or legs.  You develop severe back pain that is not controlled with medicine.  You have a sudden change in bowel or bladder control.  You develop shortness of breath, dizziness, or you faint.  You develop nausea, vomiting, or sweating.  You have back pain that is a rhythmic, cramping pain similar to labor pains. Labor pain is usually 1-2 minutes apart, lasts for about 1 minute, and involves a bearing down feeling or pressure in your pelvis.  You have back pain and your water breaks or you have vaginal bleeding.  You have back pain or numbness that travels down your leg.  Your back pain developed after you fell.  You develop pain on one side of your back.  You see blood in your urine.  You develop skin blisters in the area of your back pain. This information is not intended to replace advice given to you by your health care provider. Make sure you discuss any questions you have with your health care provider. Document Released: 07/08/2005 Document Revised: 09/05/2015 Document Reviewed: 12/12/2014 Elsevier Interactive Patient Education  2018 [employer]. Common Medications Safe in Pregnancy  Acne:      Constipation:  Benzoyl Peroxide     Colace  Clindamycin      Dulcolax Suppository  Topica Erythromycin     Fibercon  Salicylic Acid      Metamucil         Miralax AVOID:        Senakot   Accutane    Cough:  Retin-A       Cough Drops  Tetracycline      Phenergan w/ Codeine if Rx  Minocycline      Robitussin (Plain & DM)  Antibiotics:     Crabs/Lice:  Ceclor       RID  Cephalosporins    AVOID:  E-Mycins      Kwell  Keflex  Macrobid/Macrodantin   Diarrhea:  Penicillin      Kao-Pectate  Zithromax      Imodium AD         PUSH  FLUIDS AVOID:       Cipro     Fever:  Tetracycline      Tylenol (Regular or Extra  Minocycline       Strength)  Levaquin      Extra Strength-Do not          Exceed 8 tabs/24 hrs Caffeine:        <232m/day (equiv. To 1 cup of coffee or  approx. 3 12 oz sodas)         Gas: Cold/Hayfever:       Gas-X  Benadryl  Mylicon  Claritin       Phazyme  **Claritin-D        Chlor-Trimeton    Headaches:  Dimetapp      ASA-Free Excedrin  Drixoral-Non-Drowsy     Cold Compress  Mucinex (Guaifenasin)     Tylenol (Regular or Extra  Sudafed/Sudafed-12 Hour     Strength)  **Sudafed PE Pseudoephedrine   Tylenol Cold & Sinus     Vicks Vapor Rub  Zyrtec  **AVOID if Problems With Blood Pressure         Heartburn: Avoid lying down for at least 1 hour after meals  Aciphex      Maalox     Rash:  Milk of Magnesia     Benadryl    Mylanta       1% Hydrocortisone Cream  Pepcid  Pepcid Complete   Sleep Aids:  Prevacid      Ambien   Prilosec       Benadryl  Rolaids       Chamomile Tea  Tums (Limit 4/day)     Unisom  Zantac       Tylenol PM         Warm milk-add vanilla or  Hemorrhoids:       Sugar for taste  Anusol/Anusol H.C.  (RX: Analapram 2.5%)  Sugar Substitutes:  Hydrocortisone OTC     Ok in moderation  Preparation H      Tucks        Vaseline lotion applied to tissue with wiping    Herpes:     Throat:  Acyclovir      Oragel  Famvir  Valtrex     Vaccines:         Flu Shot Leg Cramps:       *Gardasil  Benadryl      Hepatitis A         Hepatitis B Nasal Spray:       Pneumovax  Saline Nasal Spray     Polio Booster         Tetanus Nausea:       Tuberculosis test or PPD  Vitamin B6 25 mg TID   AVOID:    Dramamine      *Gardasil  Emetrol       Live Poliovirus  Ginger Root 250 mg QID    MMR (measles, mumps &  High Complex Carbs @ Bedtime    rebella)  Sea Bands-Accupressure    Varicella (Chickenpox)  Unisom 1/2 tab TID     *No known complications           If received  before Pain:         Known pregnancy;   Darvocet       Resume series after  Lortab        Delivery  Percocet    Yeast:   Tramadol      Femstat  Tylenol 3      Gyne-lotrimin  Ultram       Monistat  Vicodin           MISC:         All Sunscreens           Hair Coloring/highlights          Insect Repellant's          (Including DEET)         Mystic Tans Third Trimester of Pregnancy The third trimester is from week 29 through week 42, months 7  through 9. This trimester is when your unborn baby (fetus) is growing very fast. At the end of the ninth month, the unborn baby is about 20 inches in length. It weighs about 6-10 pounds. Follow these instructions at home:  Avoid all smoking, herbs, and alcohol. Avoid drugs not approved by your doctor.  Do not use any tobacco products, including cigarettes, chewing tobacco, and electronic cigarettes. If you need help quitting, ask your doctor. You may get counseling or other support to help you quit.  Only take medicine as told by your doctor. Some medicines are safe and some are not during pregnancy.  Exercise only as told by your doctor. Stop exercising if you start having cramps.  Eat regular, healthy meals.  Wear a good support bra if your breasts are tender.  Do not use hot tubs, steam rooms, or saunas.  Wear your seat belt when driving.  Avoid raw meat, uncooked cheese, and liter boxes and soil used by cats.  Take your prenatal vitamins.  Take 1500-2000 milligrams of calcium daily starting at the 20th week of pregnancy until you deliver your baby.  Try taking medicine that helps you poop (stool softener) as needed, and if your doctor approves. Eat more fiber by eating fresh fruit, vegetables, and whole grains. Drink enough fluids to keep your pee (urine) clear or pale yellow.  Take warm water baths (sitz baths) to soothe pain or discomfort caused by hemorrhoids. Use hemorrhoid cream if your doctor approves.  If you have puffy,  bulging veins (varicose veins), wear support hose. Raise (elevate) your feet for 15 minutes, 3-4 times a day. Limit salt in your diet.  Avoid heavy lifting, wear low heels, and sit up straight.  Rest with your legs raised if you have leg cramps or low back pain.  Visit your dentist if you have not gone during your pregnancy. Use a soft toothbrush to brush your teeth. Be gentle when you floss.  You can have sex (intercourse) unless your doctor tells you not to.  Do not travel far distances unless you must. Only do so with your doctor's approval.  Take prenatal classes.  Practice driving to the hospital.  Pack your hospital bag.  Prepare the baby's room.  Go to your doctor visits. Get help if:  You are not sure if you are in labor or if your water has broken.  You are dizzy.  You have mild cramps or pressure in your lower belly (abdominal).  You have a nagging pain in your belly area.  You continue to feel sick to your stomach (nauseous), throw up (vomit), or have watery poop (diarrhea).  You have bad smelling fluid coming from your vagina.  You have pain with peeing (urination). Get help right away if:  You have a fever.  You are leaking fluid from your vagina.  You are spotting or bleeding from your vagina.  You have severe belly cramping or pain.  You lose or gain weight rapidly.  You have trouble catching your breath and have chest pain.  You notice sudden or extreme puffiness (swelling) of your face, hands, ankles, feet, or legs.  You have not felt the baby move in over an hour.  You have severe headaches that do not go away with medicine.  You have vision changes. This information is not intended to replace advice given to you by your health care provider. Make sure you discuss any questions you have with your health care provider. Document Released: 06/24/2009 Document  Revised: 09/05/2015 Document Reviewed: 05/31/2012 Elsevier Interactive Patient  Education  2017 Else[employer] Inc[employer]   Pain Relief During Labor and Delivery Many things can cause pain during labor and delivery, including:  Pressure on bones and ligaments due to the baby moving through the pelvis.  Stretching of tissues due to the baby moving through the birth canal.  Muscle tension due to anxiety or nervousness.  The uterus tightening (contracting) and relaxing to help move the baby.  There are many ways to deal with the pain of labor and delivery. They include:  Taking prenatal classes. Taking these classes helps you know what to expect during your baby's birth. What you learn will increase your confidence and decrease your anxiety.  Practicing relaxation techniques or doing relaxing activities, such as: ? Focused breathing. ? Meditation. ? Visualization. ? Aroma therapy. ? Listening to your favorite music. ? Hypnosis.  Taking a warm shower or bath (hydrotherapy). This may: ? Provide comfort and relaxation. ? Lessen your perception of pain. ? Decrease the amount of pain medicine needed. ? Decrease the length of labor.  Getting a massage or counterpressure on your back.  Applying warm packs or ice packs.  Changing positions often, moving around, or using a birthing ball.  Getting: ? Pain medicine through an IV or injection into a muscle. ? Pain medicine inserted into your spinal column. ? Injections of sterile water just under the skin on your lower back (intradermal injections). ? Laughing gas (nitrous oxide).  Discuss your pain control options with your health care provider during your prenatal visits. Explore the options offered by your hospital or birth center. What kinds of medicine are available? There are two kinds of medicines that can be used to relieve pain during labor and delivery:  Analgesics. These medicines decrease pain without causing you to lose feeling or the ability to move your muscles.  Anesthetics. These medicines block feeling  in the body and can decrease your ability to move freely.  Both of these kinds of medicine can cause minor side effects, such as nausea, trouble concentrating, and sleepiness. They can also decrease the baby's heart rate before birth and affect the baby's breathing rate after birth. For this reason, health care providers are careful about when and how much medicine is given. What are specific medicines and procedures that provide pain relief? Local Anesthetics Local anesthetics are used to numb a small area of the body. They may be used along with another kind of anesthetic or used to numb the nerves of the vagina, cervix, and perineum during the second stage of labor. General Anesthetics General anesthetics cause you to lose consciousness so you do not feel pain. They are usually only used for an emergency cesarean delivery. General anesthetics are given through an IV tube and a mask. Pudendal Block A pudendal block is a form of local anesthetic. It may be used to relieve the pain associated with pushing or stretching of the perineum at the time of delivery or to further numb the perineum. A pudendal block is done by injecting numbing medicine through the vaginal wall into a nerve in the pelvis. Epidural Analgesia Epidural analgesia is given through a flexible IV catheter that is inserted into the lower back. Numbing medicine is delivered continuously to the area near your spinal column nerves (epidural space). After having this type of analgesia, you may be able to move your legs but you most likely will not be able to walk. Depending on the amount of medicine  given, you may lose all feeling in the lower half of your body, or you may retain some level of sensation, including the urge to push. Epidural analgesia can be used to provide pain relief for a vaginal birth. Spinal Block A spinal block is similar to epidural analgesia, but the medicine is injected into the spinal fluid instead of the epidural  space. A spinal block is only given once. It starts to relieve pain quickly, but the pain relief lasts only 1-6 hours. Spinal blocks can be used for cesarean deliveries. Combined Spinal-Epidural (CSE) Block A CSE block combines the effects of a spinal block and epidural analgesia. The spinal block works quickly to block all pain. The epidural analgesia provides continuous pain relief, even after the effects of the spinal block have worn off. This information is not intended to replace advice given to you by your health care provider. Make sure you discuss any questions you have with your health care provider. Document Released: 07/16/2008 Document Revised: 09/06/2015 Document Reviewed: 08/21/2015 Elsevier Interactive Patient Education  2018 [place] [employer].  WHAT OB PATIENTS CAN EXPECT   Confirmation of pregnancy and ultrasound ordered if medically indicated-[redacted] weeks gestation  New OB (NOB) intake with nurse and New OB (NOB) labs- [redacted] weeks gestation  New OB (NOB) physical examination with provider- 11/[redacted] weeks gestation  Flu vaccine-[redacted] weeks gestation  Anatomy scan-[redacted] weeks gestation  Glucose tolerance test, blood work to test for anemia, T-dap vaccine-[redacted] weeks gestation  Vaginal swabs/cultures-STD/Group B strep-[redacted] weeks gestation  Appointments every 4 weeks until 28 weeks  Every 2 weeks from 28 weeks until 36 weeks  Weekly visits from 36 weeks until delivery

## 2017-09-24 LAB — CBC
Hematocrit: 36 % (ref 34.0–46.6)
Hemoglobin: 12.7 g/dL (ref 11.1–15.9)
MCH: 31.7 pg (ref 26.6–33.0)
MCHC: 35.3 g/dL (ref 31.5–35.7)
MCV: 90 fL (ref 79–97)
PLATELETS: 247 10*3/uL (ref 150–450)
RBC: 4.01 x10E6/uL (ref 3.77–5.28)
RDW: 13.5 % (ref 12.3–15.4)
WBC: 9.1 10*3/uL (ref 3.4–10.8)

## 2017-09-24 LAB — GLUCOSE, 1 HOUR GESTATIONAL: Gestational Diabetes Screen: 144 mg/dL — ABNORMAL HIGH (ref 65–139)

## 2017-09-24 LAB — RPR: RPR Ser Ql: NONREACTIVE

## 2017-09-27 ENCOUNTER — Telehealth: Payer: Self-pay | Admitting: Certified Nurse Midwife

## 2017-09-27 ENCOUNTER — Encounter: Payer: Self-pay | Admitting: Certified Nurse Midwife

## 2017-09-27 ENCOUNTER — Other Ambulatory Visit: Payer: Self-pay | Admitting: Certified Nurse Midwife

## 2017-09-27 DIAGNOSIS — Z3483 Encounter for supervision of other normal pregnancy, third trimester: Secondary | ICD-10-CM

## 2017-09-27 DIAGNOSIS — R7309 Other abnormal glucose: Secondary | ICD-10-CM

## 2017-09-27 NOTE — Telephone Encounter (Signed)
Called patient per provider request; patient states she has to set up childcare prior to making her 3 HR GTT appointment and will call back.  **NO FURTHER ACTION REQUIRED** at this time.  Thanks.

## 2017-10-05 ENCOUNTER — Other Ambulatory Visit

## 2017-10-05 DIAGNOSIS — R7309 Other abnormal glucose: Secondary | ICD-10-CM

## 2017-10-05 DIAGNOSIS — Z3483 Encounter for supervision of other normal pregnancy, third trimester: Secondary | ICD-10-CM

## 2017-10-06 ENCOUNTER — Encounter (INDEPENDENT_AMBULATORY_CARE_PROVIDER_SITE_OTHER): Payer: Self-pay

## 2017-10-06 LAB — GESTATIONAL GLUCOSE TOLERANCE
Glucose, Fasting: 76 mg/dL (ref 65–94)
Glucose, GTT - 1 Hour: 173 mg/dL (ref 65–179)
Glucose, GTT - 2 Hour: 150 mg/dL (ref 65–154)
Glucose, GTT - 3 Hour: 124 mg/dL (ref 65–139)

## 2017-10-11 ENCOUNTER — Ambulatory Visit (INDEPENDENT_AMBULATORY_CARE_PROVIDER_SITE_OTHER): Admitting: Certified Nurse Midwife

## 2017-10-11 ENCOUNTER — Encounter: Payer: Self-pay | Admitting: Certified Nurse Midwife

## 2017-10-11 VITALS — BP 99/58 | HR 75 | Wt 195.0 lb

## 2017-10-11 DIAGNOSIS — Z3A30 30 weeks gestation of pregnancy: Secondary | ICD-10-CM

## 2017-10-11 DIAGNOSIS — Z3483 Encounter for supervision of other normal pregnancy, third trimester: Secondary | ICD-10-CM

## 2017-10-11 LAB — POCT URINALYSIS DIPSTICK
Bilirubin, UA: NEGATIVE
Glucose, UA: NEGATIVE
Ketones, UA: NEGATIVE
Leukocytes, UA: NEGATIVE
NITRITE UA: NEGATIVE
Protein, UA: NEGATIVE
RBC UA: NEGATIVE
Spec Grav, UA: 1.01 (ref 1.010–1.025)
Urobilinogen, UA: 0.2 E.U./dL
pH, UA: 6 (ref 5.0–8.0)

## 2017-10-11 NOTE — Progress Notes (Signed)
Pt is here for an ROB visit. 

## 2017-10-11 NOTE — Patient Instructions (Signed)

## 2017-10-11 NOTE — Addendum Note (Signed)
Addended by: Brooke DareSICK, Etoile Looman L on: 10/11/2017 02:30 PM   Modules accepted: Orders

## 2017-10-11 NOTE — Progress Notes (Signed)
ROB,doing well,good fetal movement,irregular contractions.Anticipatory guidance given of prenatal care Red flag symptoms reviewed and when to call. Patient verbalizes understanding. RTC in 2 weeks.  Shanika Creacy,SNM/Shellie Rogoff,CNM

## 2017-10-27 ENCOUNTER — Ambulatory Visit (INDEPENDENT_AMBULATORY_CARE_PROVIDER_SITE_OTHER): Admitting: Obstetrics and Gynecology

## 2017-10-27 VITALS — BP 99/60 | HR 86 | Wt 196.2 lb

## 2017-10-27 DIAGNOSIS — Z3493 Encounter for supervision of normal pregnancy, unspecified, third trimester: Secondary | ICD-10-CM

## 2017-10-27 NOTE — Progress Notes (Signed)
ROB-doing well, no concerns. 

## 2017-10-27 NOTE — Progress Notes (Signed)
Pt is here for an ROB visit. 

## 2017-11-10 ENCOUNTER — Ambulatory Visit (INDEPENDENT_AMBULATORY_CARE_PROVIDER_SITE_OTHER): Admitting: Obstetrics and Gynecology

## 2017-11-10 VITALS — BP 110/75 | HR 95 | Wt 199.6 lb

## 2017-11-10 DIAGNOSIS — Z3493 Encounter for supervision of normal pregnancy, unspecified, third trimester: Secondary | ICD-10-CM

## 2017-11-10 LAB — POCT URINALYSIS DIPSTICK
BILIRUBIN UA: NEGATIVE
GLUCOSE UA: NEGATIVE
Ketones, UA: NEGATIVE
Leukocytes, UA: NEGATIVE
Nitrite, UA: NEGATIVE
Protein, UA: NEGATIVE
RBC UA: NEGATIVE
SPEC GRAV UA: 1.015 (ref 1.010–1.025)
Urobilinogen, UA: 0.2 E.U./dL
pH, UA: 7 (ref 5.0–8.0)

## 2017-11-10 NOTE — Progress Notes (Signed)
ROB- pt is having some pelvic pressure 

## 2017-11-10 NOTE — Progress Notes (Signed)
ROB-doing well. BHC discussed.

## 2017-11-17 ENCOUNTER — Encounter: Admitting: Obstetrics and Gynecology

## 2017-11-18 ENCOUNTER — Ambulatory Visit (INDEPENDENT_AMBULATORY_CARE_PROVIDER_SITE_OTHER): Admitting: Obstetrics and Gynecology

## 2017-11-18 VITALS — BP 111/66 | HR 105 | Wt 201.1 lb

## 2017-11-18 DIAGNOSIS — Z3493 Encounter for supervision of normal pregnancy, unspecified, third trimester: Secondary | ICD-10-CM

## 2017-11-18 DIAGNOSIS — Z113 Encounter for screening for infections with a predominantly sexual mode of transmission: Secondary | ICD-10-CM

## 2017-11-18 DIAGNOSIS — Z3685 Encounter for antenatal screening for Streptococcus B: Secondary | ICD-10-CM

## 2017-11-18 LAB — POCT URINALYSIS DIPSTICK
Bilirubin, UA: NEGATIVE
Blood, UA: NEGATIVE
Glucose, UA: NEGATIVE
Ketones, UA: NEGATIVE
LEUKOCYTES UA: NEGATIVE
NITRITE UA: NEGATIVE
PROTEIN UA: NEGATIVE
SPEC GRAV UA: 1.01 (ref 1.010–1.025)
Urobilinogen, UA: 0.2 E.U./dL
pH, UA: 6.5 (ref 5.0–8.0)

## 2017-11-18 NOTE — Progress Notes (Signed)
ROB- cultures obtained, pt is having a lot pelvic pressure

## 2017-11-18 NOTE — Progress Notes (Signed)
ROB & cultures obtained, labor precautions discussed, having restless leg, recommend tonic water as needed. Grandfather is not doing well and at hospice.

## 2017-11-20 LAB — STREP GP B NAA: Strep Gp B NAA: NEGATIVE

## 2017-11-21 LAB — GC/CHLAMYDIA PROBE AMP
Chlamydia trachomatis, NAA: NEGATIVE
NEISSERIA GONORRHOEAE BY PCR: NEGATIVE

## 2017-11-24 ENCOUNTER — Ambulatory Visit (INDEPENDENT_AMBULATORY_CARE_PROVIDER_SITE_OTHER): Admitting: Certified Nurse Midwife

## 2017-11-24 ENCOUNTER — Encounter: Payer: Self-pay | Admitting: Certified Nurse Midwife

## 2017-11-24 VITALS — BP 102/64 | HR 96 | Wt 203.2 lb

## 2017-11-24 DIAGNOSIS — Z3483 Encounter for supervision of other normal pregnancy, third trimester: Secondary | ICD-10-CM

## 2017-11-24 NOTE — Progress Notes (Signed)
Pt is here for an ROB visit. She has had some strong contractions and would like to be checked.

## 2017-11-24 NOTE — Patient Instructions (Signed)
Braxton Hicks Contractions °Contractions of the uterus can occur throughout pregnancy, but they are not always a sign that you are in labor. You may have practice contractions called Braxton Hicks contractions. These false labor contractions are sometimes confused with true labor. °What are Braxton Hicks contractions? °Braxton Hicks contractions are tightening movements that occur in the muscles of the uterus before labor. Unlike true labor contractions, these contractions do not result in opening (dilation) and thinning of the cervix. Toward the end of pregnancy (32-34 weeks), Braxton Hicks contractions can happen more often and may become stronger. These contractions are sometimes difficult to tell apart from true labor because they can be very uncomfortable. You should not feel embarrassed if you go to the hospital with false labor. °Sometimes, the only way to tell if you are in true labor is for your health care provider to look for changes in the cervix. The health care provider will do a physical exam and may monitor your contractions. If you are not in true labor, the exam should show that your cervix is not dilating and your water has not broken. °If there are other health problems associated with your pregnancy, it is completely safe for you to be sent home with false labor. You may continue to have Braxton Hicks contractions until you go into true labor. °How to tell the difference between true labor and false labor °True labor °· Contractions last 30-70 seconds. °· Contractions become very regular. °· Discomfort is usually felt in the top of the uterus, and it spreads to the lower abdomen and low back. °· Contractions do not go away with walking. °· Contractions usually become more intense and increase in frequency. °· The cervix dilates and gets thinner. °False labor °· Contractions are usually shorter and not as strong as true labor contractions. °· Contractions are usually irregular. °· Contractions  are often felt in the front of the lower abdomen and in the groin. °· Contractions may go away when you walk around or change positions while lying down. °· Contractions get weaker and are shorter-lasting as time goes on. °· The cervix usually does not dilate or become thin. °Follow these instructions at home: °· Take over-the-counter and prescription medicines only as told by your health care provider. °· Keep up with your usual exercises and follow other instructions from your health care provider. °· Eat and drink lightly if you think you are going into labor. °· If Braxton Hicks contractions are making you uncomfortable: °? Change your position from lying down or resting to walking, or change from walking to resting. °? Sit and rest in a tub of warm water. °? Drink enough fluid to keep your urine pale yellow. Dehydration may cause these contractions. °? Do slow and deep breathing several times an hour. °· Keep all follow-up prenatal visits as told by your health care provider. This is important. °Contact a health care provider if: °· You have a fever. °· You have continuous pain in your abdomen. °Get help right away if: °· Your contractions become stronger, more regular, and closer together. °· You have fluid leaking or gushing from your vagina. °· You pass blood-tinged mucus (bloody show). °· You have bleeding from your vagina. °· You have low back pain that you never had before. °· You feel your baby’s head pushing down and causing pelvic pressure. °· Your baby is not moving inside you as much as it used to. °Summary °· Contractions that occur before labor are called Braxton   Hicks contractions, false labor, or practice contractions. °· Braxton Hicks contractions are usually shorter, weaker, farther apart, and less regular than true labor contractions. True labor contractions usually become progressively stronger and regular and they become more frequent. °· Manage discomfort from Braxton Hicks contractions by  changing position, resting in a warm bath, drinking plenty of water, or practicing deep breathing. °This information is not intended to replace advice given to you by your health care provider. Make sure you discuss any questions you have with your health care provider. °Document Released: 08/13/2016 Document Revised: 08/13/2016 Document Reviewed: 08/13/2016 °Elsevier Interactive Patient Education © 2018 [employer] Inc. ° °

## 2017-11-24 NOTE — Progress Notes (Signed)
Daisy Hancock, doing well. Is uncomfortable and had episode of contractions last night that eventually stopped. She feels good movement and denies LOF/bleeding. Labor precautions reviewed. GBS results discussed. Follow up 1 wk.   Doreene BurkeAnnie Jolanta Cabeza, CNM

## 2017-12-01 ENCOUNTER — Ambulatory Visit (INDEPENDENT_AMBULATORY_CARE_PROVIDER_SITE_OTHER): Admitting: Obstetrics and Gynecology

## 2017-12-01 VITALS — BP 103/64 | HR 96 | Wt 201.7 lb

## 2017-12-01 DIAGNOSIS — Z3493 Encounter for supervision of normal pregnancy, unspecified, third trimester: Secondary | ICD-10-CM

## 2017-12-01 LAB — POCT URINALYSIS DIPSTICK OB
Bilirubin, UA: NEGATIVE
Blood, UA: NEGATIVE
Glucose, UA: NEGATIVE — AB
Ketones, UA: NEGATIVE
LEUKOCYTES UA: NEGATIVE
NITRITE UA: NEGATIVE
PROTEIN: NEGATIVE
SPEC GRAV UA: 1.01 (ref 1.010–1.025)
Urobilinogen, UA: 0.2 E.U./dL
pH, UA: 6 (ref 5.0–8.0)

## 2017-12-01 NOTE — Progress Notes (Signed)
ROB- doing well except for head cold. Labor precautions discussed.

## 2017-12-01 NOTE — Progress Notes (Signed)
ROB- pt is having a lot of pelvic pressure, some contractions, c/o sore throat, drainage

## 2017-12-08 ENCOUNTER — Ambulatory Visit (INDEPENDENT_AMBULATORY_CARE_PROVIDER_SITE_OTHER): Admitting: Obstetrics and Gynecology

## 2017-12-08 VITALS — BP 108/72 | HR 97 | Wt 204.3 lb

## 2017-12-08 DIAGNOSIS — Z3493 Encounter for supervision of normal pregnancy, unspecified, third trimester: Secondary | ICD-10-CM | POA: Diagnosis not present

## 2017-12-08 LAB — POCT URINALYSIS DIPSTICK OB
BILIRUBIN UA: NEGATIVE
Blood, UA: NEGATIVE
Glucose, UA: NEGATIVE
KETONES UA: NEGATIVE
NITRITE UA: NEGATIVE
PROTEIN: NEGATIVE
SPEC GRAV UA: 1.015 (ref 1.010–1.025)
UROBILINOGEN UA: 0.2 U/dL
pH, UA: 6 (ref 5.0–8.0)

## 2017-12-08 NOTE — Progress Notes (Signed)
ROB-doing well other than tired of being pregnant.

## 2017-12-08 NOTE — Progress Notes (Signed)
ROB- pt is having pelvic pressure, contractions 

## 2017-12-14 ENCOUNTER — Observation Stay
Admission: EM | Admit: 2017-12-14 | Discharge: 2017-12-14 | Disposition: A | Discharge status: Home / Self Care | Source: Home / Self Care | Admitting: Obstetrics and Gynecology

## 2017-12-14 ENCOUNTER — Other Ambulatory Visit: Payer: Self-pay

## 2017-12-14 DIAGNOSIS — O368130 Decreased fetal movements, third trimester, not applicable or unspecified: Secondary | ICD-10-CM | POA: Insufficient documentation

## 2017-12-14 DIAGNOSIS — Z3A39 39 weeks gestation of pregnancy: Secondary | ICD-10-CM | POA: Insufficient documentation

## 2017-12-14 DIAGNOSIS — R7309 Other abnormal glucose: Secondary | ICD-10-CM

## 2017-12-14 MED ORDER — ACETAMINOPHEN 325 MG PO TABS: 650.0000 mg | ORAL_TABLET | ORAL | Status: DC | PRN

## 2017-12-14 NOTE — OB Triage Note (Signed)
Pt presents c/o no fetal movement today. Pt has had plenty to eat and drink today. Denies any vaginal bleeding or LOF. Patient has had some contractions off and on. Vitals WNL. Will continue to monitor

## 2017-12-14 NOTE — OB Triage Note (Signed)
L&D OB Triage Note  SUBJECTIVE Demeshia Yoke is a 32 y.o. [id number] female at [redacted]w[redacted]d, EDD Estimated Date of Delivery: 12/15/17 who presented to triage with complaints of irregular contractions and decreased fetal movement.   OB History  Gravida Para Term Preterm AB Living  4 1 1  0 2 1  SAB TAB Ectopic Multiple Live Births  2 0 0 0 1    # Outcome Date GA Lbr Len/2nd Weight Sex Delivery Anes PTL Lv  4 Current           3 Term 03/13/14    F Vag-Spont   LIV  2 SAB           1 SAB             Medications Prior to Admission  Medication Sig Dispense Refill Last Dose  . Cholecalciferol (VITAMIN D) 2000 units tablet Take 2,000 Units by mouth daily.   12/13/2017 at Unknown time  . loratadine (CLARITIN) 10 MG tablet Take 10 mg by mouth daily.   12/13/2017 at Unknown time  . SYNTHROID 50 MCG tablet TAKE 1 TABLET DAILY 90 tablet 4 12/13/2017 at Unknown time     OBJECTIVE  Nursing Evaluation:   BP 108/69 (BP Location: Left Arm)   Pulse (!) 122   Temp 98.2 F (36.8 C) (Oral)   Resp 18   Ht 5\' 5"  (1.651 m)   Wt 91.2 kg   LMP 03/10/2017   SpO2 100%   BMI 33.45 kg/m    Findings:   Reactive NST  NST was performed and has been reviewed by me.  NST INTERPRETATION: Category I  Mode: External Baseline Rate (A): 140 bpm Variability: Moderate Accelerations: 15 x 15 Decelerations: None     Contraction Frequency (min): 5-7  ASSESSMENT Impression:  1.  Pregnancy:  [id number] at [redacted]w[redacted]d , EDD Estimated Date of Delivery: 12/15/17 2.  NST:  Category I  3. No cervical change in 2hrs.   PLAN 1. Reassurance given  2. Discharge home with standard labor precautions given to return to L&D or call the office for problems. 3. Continue routine prenatal care.  Doreene Burke, CNM

## 2017-12-15 ENCOUNTER — Observation Stay
Admission: EM | Admit: 2017-12-15 | Discharge: 2017-12-15 | Disposition: A | Discharge status: Home / Self Care | Source: Home / Self Care | Admitting: Obstetrics and Gynecology

## 2017-12-15 ENCOUNTER — Inpatient Hospital Stay: Admit: 2017-12-15 | Payer: Self-pay

## 2017-12-15 ENCOUNTER — Other Ambulatory Visit: Payer: Self-pay

## 2017-12-15 DIAGNOSIS — Z3490 Encounter for supervision of normal pregnancy, unspecified, unspecified trimester: Secondary | ICD-10-CM

## 2017-12-15 DIAGNOSIS — R7309 Other abnormal glucose: Secondary | ICD-10-CM

## 2017-12-15 DIAGNOSIS — O480 Post-term pregnancy: Secondary | ICD-10-CM

## 2017-12-15 DIAGNOSIS — O4703 False labor before 37 completed weeks of gestation, third trimester: Secondary | ICD-10-CM | POA: Diagnosis not present

## 2017-12-15 DIAGNOSIS — Z3A40 40 weeks gestation of pregnancy: Secondary | ICD-10-CM | POA: Diagnosis not present

## 2017-12-15 MED ORDER — PROMETHAZINE HCL 25 MG/ML IJ SOLN
INTRAMUSCULAR | Status: AC
  Administered 2017-12-15: 12.5 mg via INTRAMUSCULAR
  Filled 2017-12-15: qty 1

## 2017-12-15 MED ORDER — MORPHINE SULFATE (PF) 4 MG/ML IV SOLN
8.0000 mg | Freq: Once | INTRAVENOUS | Status: AC
  Administered 2017-12-15: 8 mg via INTRAMUSCULAR

## 2017-12-15 MED ORDER — ZOLPIDEM TARTRATE 5 MG PO TABS
5.0000 mg | ORAL_TABLET | Freq: Once | ORAL | Status: AC
  Administered 2017-12-15: 5 mg via ORAL
  Filled 2017-12-15: qty 1

## 2017-12-15 MED ORDER — PROMETHAZINE HCL 25 MG/ML IJ SOLN
12.5000 mg | Freq: Once | INTRAMUSCULAR | Status: AC
  Administered 2017-12-15: 12.5 mg via INTRAMUSCULAR

## 2017-12-15 MED ORDER — MORPHINE SULFATE (PF) 4 MG/ML IV SOLN
INTRAVENOUS | Status: AC
  Administered 2017-12-15: 8 mg via INTRAMUSCULAR
  Filled 2017-12-15: qty 2

## 2017-12-15 NOTE — OB Triage Note (Signed)
Patient presented to L&D with complaints of contractions since this morning, reports having lost her mucus plug today.Denies vaginal bleeding, leaking of fluid or decreased fetal movements

## 2017-12-16 ENCOUNTER — Encounter: Admitting: Certified Nurse Midwife

## 2017-12-16 ENCOUNTER — Other Ambulatory Visit: Payer: Self-pay

## 2017-12-16 ENCOUNTER — Inpatient Hospital Stay
Admission: EM | Admit: 2017-12-16 | Discharge: 2017-12-17 | DRG: 807 | Disposition: A | Discharge status: Home / Self Care | Attending: Certified Nurse Midwife | Admitting: Certified Nurse Midwife

## 2017-12-16 DIAGNOSIS — Z3483 Encounter for supervision of other normal pregnancy, third trimester: Secondary | ICD-10-CM | POA: Diagnosis present

## 2017-12-16 DIAGNOSIS — E079 Disorder of thyroid, unspecified: Secondary | ICD-10-CM | POA: Diagnosis present

## 2017-12-16 DIAGNOSIS — O99284 Endocrine, nutritional and metabolic diseases complicating childbirth: Secondary | ICD-10-CM | POA: Diagnosis present

## 2017-12-16 DIAGNOSIS — Z3A40 40 weeks gestation of pregnancy: Secondary | ICD-10-CM

## 2017-12-16 LAB — CBC
HCT: 35.9 % (ref 35.0–47.0)
Hemoglobin: 12.6 g/dL (ref 12.0–16.0)
MCH: 30.8 pg (ref 26.0–34.0)
MCHC: 35 g/dL (ref 32.0–36.0)
MCV: 88.1 fL (ref 80.0–100.0)
PLATELETS: 237 10*3/uL (ref 150–440)
RBC: 4.07 MIL/uL (ref 3.80–5.20)
RDW: 14.5 % (ref 11.5–14.5)
WBC: 16.9 10*3/uL — ABNORMAL HIGH (ref 3.6–11.0)

## 2017-12-16 LAB — TYPE AND SCREEN
ABO/RH(D): A POS
Antibody Screen: NEGATIVE

## 2017-12-16 MED ORDER — ONDANSETRON HCL 4 MG PO TABS: 4.0000 mg | ORAL_TABLET | ORAL | Status: DC | PRN

## 2017-12-16 MED ORDER — LACTATED RINGERS IV SOLN: 500.0000 mL | INTRAVENOUS | Status: DC | PRN

## 2017-12-16 MED ORDER — DIBUCAINE 1 % RE OINT: 1.0000 "application " | TOPICAL_OINTMENT | RECTAL | Status: DC | PRN

## 2017-12-16 MED ORDER — LIDOCAINE HCL (PF) 1 % IJ SOLN
INTRAMUSCULAR | Status: AC
  Administered 2017-12-16: 30 mL
  Filled 2017-12-16: qty 30

## 2017-12-16 MED ORDER — SOD CITRATE-CITRIC ACID 500-334 MG/5ML PO SOLN: 30.0000 mL | ORAL | Status: DC | PRN

## 2017-12-16 MED ORDER — BUTORPHANOL TARTRATE 1 MG/ML IJ SOLN: 1.0000 mg | INTRAMUSCULAR | Status: DC | PRN

## 2017-12-16 MED ORDER — ONDANSETRON HCL 4 MG/2ML IJ SOLN: 4.0000 mg | INTRAMUSCULAR | Status: DC | PRN

## 2017-12-16 MED ORDER — SODIUM CHLORIDE 0.9% FLUSH: 3.0000 mL | Freq: Two times a day (BID) | INTRAVENOUS | Status: DC

## 2017-12-16 MED ORDER — ONDANSETRON HCL 4 MG/2ML IJ SOLN: 4.0000 mg | Freq: Four times a day (QID) | INTRAMUSCULAR | Status: DC | PRN

## 2017-12-16 MED ORDER — SODIUM CHLORIDE 0.9 % IV SOLN: 250.0000 mL | INTRAVENOUS | Status: DC | PRN

## 2017-12-16 MED ORDER — OXYTOCIN 10 UNIT/ML IJ SOLN
INTRAMUSCULAR | Status: AC
  Administered 2017-12-16: 10 [IU]
  Filled 2017-12-16: qty 2

## 2017-12-16 MED ORDER — LEVOTHYROXINE SODIUM 50 MCG PO TABS
50.0000 ug | ORAL_TABLET | Freq: Every day | ORAL | Status: DC
  Administered 2017-12-16 – 2017-12-17 (×2): 50 ug via ORAL
  Filled 2017-12-16 (×2): qty 1

## 2017-12-16 MED ORDER — ACETAMINOPHEN 325 MG PO TABS: 650.0000 mg | ORAL_TABLET | ORAL | Status: DC | PRN

## 2017-12-16 MED ORDER — SENNOSIDES-DOCUSATE SODIUM 8.6-50 MG PO TABS
2.0000 | ORAL_TABLET | ORAL | Status: DC
  Administered 2017-12-16: 2 via ORAL
  Filled 2017-12-16: qty 2

## 2017-12-16 MED ORDER — AMMONIA AROMATIC IN INHA
RESPIRATORY_TRACT | Status: AC
  Filled 2017-12-16: qty 10

## 2017-12-16 MED ORDER — SODIUM CHLORIDE FLUSH 0.9 % IV SOLN
INTRAVENOUS | Status: AC
  Filled 2017-12-16: qty 10

## 2017-12-16 MED ORDER — DIPHENHYDRAMINE HCL 25 MG PO CAPS: 25.0000 mg | ORAL_CAPSULE | Freq: Four times a day (QID) | ORAL | Status: DC | PRN

## 2017-12-16 MED ORDER — BENZOCAINE-MENTHOL 20-0.5 % EX AERO
1.0000 "application " | INHALATION_SPRAY | CUTANEOUS | Status: DC | PRN
  Filled 2017-12-16: qty 56

## 2017-12-16 MED ORDER — LIDOCAINE HCL (PF) 1 % IJ SOLN: 30.0000 mL | INTRAMUSCULAR | Status: DC | PRN

## 2017-12-16 MED ORDER — OXYCODONE-ACETAMINOPHEN 5-325 MG PO TABS: 1.0000 | ORAL_TABLET | ORAL | Status: DC | PRN

## 2017-12-16 MED ORDER — COCONUT OIL OIL: 1.0000 "application " | TOPICAL_OIL | Status: DC | PRN

## 2017-12-16 MED ORDER — WITCH HAZEL-GLYCERIN EX PADS: 1.0000 "application " | MEDICATED_PAD | CUTANEOUS | Status: DC | PRN

## 2017-12-16 MED ORDER — IBUPROFEN 600 MG PO TABS
600.0000 mg | ORAL_TABLET | Freq: Four times a day (QID) | ORAL | Status: DC
  Administered 2017-12-16 – 2017-12-17 (×5): 600 mg via ORAL
  Filled 2017-12-16 (×5): qty 1

## 2017-12-16 MED ORDER — FLEET ENEMA 7-19 GM/118ML RE ENEM: 1.0000 | ENEMA | Freq: Once | RECTAL | Status: DC

## 2017-12-16 MED ORDER — SIMETHICONE 80 MG PO CHEW: 80.0000 mg | CHEWABLE_TABLET | ORAL | Status: DC | PRN

## 2017-12-16 MED ORDER — MISOPROSTOL 200 MCG PO TABS
ORAL_TABLET | ORAL | Status: AC
  Filled 2017-12-16: qty 4

## 2017-12-16 MED ORDER — SODIUM CHLORIDE 0.9% FLUSH: 3.0000 mL | INTRAVENOUS | Status: DC | PRN

## 2017-12-16 MED ORDER — OXYCODONE-ACETAMINOPHEN 5-325 MG PO TABS: 2.0000 | ORAL_TABLET | ORAL | Status: DC | PRN

## 2017-12-16 MED ORDER — PRENATAL MULTIVITAMIN CH
1.0000 | ORAL_TABLET | Freq: Every day | ORAL | Status: DC
  Administered 2017-12-16: 1 via ORAL
  Filled 2017-12-16: qty 1

## 2017-12-16 MED ORDER — OXYTOCIN 40 UNITS IN LACTATED RINGERS INFUSION - SIMPLE MED
INTRAVENOUS | Status: AC
  Filled 2017-12-16: qty 1000

## 2017-12-16 NOTE — Discharge Summary (Signed)
Obstetric Discharge Summary  Patient ID: Daisy Hancock MRN: [id number] DOB/AGE: Dec 17, 1985 32 y.o.   Date of Admission: 12/15/2017  Date of Discharge: 12/15/2017  Admitting Diagnosis: Observation at [redacted]w[redacted]d    Discharge Diagnosis: No other diagnosis   Antepartum Procedures: NST and Therapeutic Rest   Brief Hospital Course    L&D OB Triage Note  Daisy Hancock is a 32 y.o. [id number] female at [redacted]w[redacted]d, EDD Estimated Date of Delivery: 12/15/17 who presented to triage for complaints of irregular uterine contractions for the last two (2) days.  She was evaluated by the nurses with no significant findings for labor or fetal distress. Vital signs stable. An NST was performed and has been reviewed by CNM. She was treated with morphine, phenergan and ambien.   NST INTERPRETATION:  Indications: rule out uterine contractions  Mode: External Baseline Rate (A): 135 bpm Variability: Moderate Accelerations: 10 x 10, 15 x 15 Decelerations: None     Contraction Frequency (min): 2-7  Impression: reactive  Dilation: 3 Effacement (%): 90 Cervical Position: Posterior Station: -2 Presentation: Vertex Exam by:: Mindy Trogden RN    Plan: NST performed was reviewed and was found to be reactive. She was discharged home with bleeding/labor precautions.  Continue routine prenatal care. Follow up with CNM in office tomorrow as previously scheduled.   Discharge Instructions: Per After Visit Summary  Activity: Also refer to After Visit Summary  Diet: Regular  Medications: Allergies as of 12/15/2017      Reactions   Ceclor [cefaclor]    Prednisone Other (See Comments)   suicidal   Sulfa Antibiotics    GI issue      Medication List    ASK your doctor about these medications   loratadine 10 MG tablet Commonly known as:  CLARITIN Take 10 mg by mouth daily.   SYNTHROID 50 MCG tablet Generic drug:  levothyroxine TAKE 1 TABLET DAILY   Vitamin D 2000 units tablet Take 2,000 Units  by mouth daily.      Outpatient follow up:  Postpartum contraception: oral progesterone-only contraceptive  Discharged Condition: stable  Discharged to: home   Gunnar Bulla, CNM [facility], [facility]

## 2017-12-16 NOTE — Lactation Note (Signed)
This note was copied from a baby's chart. Lactation Consultation Note  Patient Name: Daisy Hancock Date: 12/16/2017 Reason for consult: Follow-up assessment   Maternal Data Does the patient have breastfeeding experience prior to this delivery?: Yes Mother stated that she does not enjoy breastfeeding but does want to give her baby colostrum. Feeding Feeding Type: Breast Fed  LATCH Score Latch: Repeated attempts needed to sustain latch, nipple held in mouth throughout feeding, stimulation needed to elicit sucking reflex.  Audible Swallowing: A few with stimulation  Type of Nipple: Everted at rest and after stimulation  Comfort (Breast/Nipple): Soft / non-tender  Hold (Positioning): Assistance needed to correctly position infant at breast and maintain latch.  LATCH Score: 7  Interventions Interventions: Breast feeding basics reviewed;Assisted with latch  Lactation Tools Discussed/Used WIC Program: No   Consult Status Consult Status: Follow-up Follow-up type: In-patient    Trudee Grip 12/16/2017, 2:30 PM

## 2017-12-16 NOTE — H&P (Signed)
Obstetric History and Physical  Daisy Hancock is a 32 y.o. [phone] with IUP at [redacted]w[redacted]d presenting with the urge to push. Patient states she has been having  regular, every two (2) to three (3) minutes contractions, minimal vaginal bleeding, clear fluid membranes since approximately 0200, with active fetal movement.    Denies difficulty breathing or respiratory distress, chest pain, dysuria and leg pain or swelling.   Prenatal Course  Source of Care: [facility] at 19+6 wks, total visits: 10  Pregnancy complications or risks: Thyroid disease in pregnancy, History of anxiety and depression  Prenatal labs and studies:  ABO, Rh: A/Positive/-- 2022-05-10 0000)   Antibody: Negative 05-10-22 0000)  Rubella: 60.2 (01/23 0000)  RPR: Non Reactive (06/13 1457)   HBsAg: Negative (01/23 0000)  HIV: Non-reactive (01/23 0000)   BPP:HKFEXMDY (08/08 0926)  1 hr Glucola: 144 (06/16 1457)  Gestational Glucose Tolerance: 76, 173, 150, 124 (06/25 0843)  Genetic screening Declined  Anatomy US Complete, Normal (04/16 1323)  Past Medical History:  Diagnosis Date  . Anxiety   . Depression   . Thyroid disease   . Vitamin D deficiency     Past Surgical History:  Procedure Laterality Date  . NO PAST SURGERIES      OB History  Gravida Para Term Preterm AB Living  4 1 1   2 1   SAB TAB Ectopic Multiple Live Births  2       1    # Outcome Date GA Lbr Len/2nd Weight Sex Delivery Anes PTL Lv  4 Current           3 Term 03/13/14    F Vag-Spont   LIV  2 SAB           1 SAB             Social History   Socioeconomic History  . Marital status: Married    Spouse name: Josh  . Number of children: 1  . Years of education: Not on file  . Highest education level: Not on file  Occupational History  . Not on file  Social Needs  . Financial resource strain: Not hard at all  . Food insecurity:    Worry: Never true    Inability: Never true  . Transportation needs:    Medical: No   Non-medical: No  Tobacco Use  . Smoking status: Never Smoker  . Smokeless tobacco: Never Used  Substance and Sexual Activity  . Alcohol use: Not Currently  . Drug use: No  . Sexual activity: Yes    Birth control/protection: Pill  Lifestyle  . Physical activity:    Days per week: 2 days    Minutes per session: 30 min  . Stress: Not at all  Relationships  . Social connections:    Talks on phone: Twice a week    Gets together: Twice a week    Attends religious service: 1 to 4 times per year    Active member of club or organization: No    Attends meetings of clubs or organizations: Never    Relationship status: Married  Other Topics Concern  . Not on file  Social History Narrative  . Not on file    Family History  Problem Relation Age of Onset  . Heart disease Maternal Grandfather     Medications Prior to Admission  Medication Sig Dispense Refill Last Dose  . Cholecalciferol (VITAMIN D) 2000 units tablet Take 2,000 Units by mouth daily.  12/13/2017 at Unknown time  . loratadine (CLARITIN) 10 MG tablet Take 10 mg by mouth daily.   12/13/2017 at Unknown time  . SYNTHROID 50 MCG tablet TAKE 1 TABLET DAILY 90 tablet 4 12/13/2017 at Unknown time    Allergies  Allergen Reactions  . Ceclor [Cefaclor]   . Prednisone Other (See Comments)    suicidal  . Sulfa Antibiotics     GI issue    Review of Systems: Negative except for what is mentioned in HPI.  Physical Exam:  BP 114/66   Pulse (!) 105   LMP 03/10/2017   GENERAL: Well-developed, well-nourished female in no acute distress.   LUNGS: Clear to auscultation bilaterally.   HEART: Regular rate and rhythm.  ABDOMEN: Soft, nontender, nondistended, gravid.  EXTREMITIES: Nontender, no edema, 2+ distal pulses.  Cervical Exam: Dilation: 10 Dilation Complete Date: 12/16/17 Dilation Complete Time: 0347 Effacement (%): 100 Cervical Position: Middle Station: Plus 2 Presentation: Vertex  FHT:  Baseline rate 145 bpm    Variability moderate  Accelerations present   Decelerations none  Contractions: Every two (2) to three (3) mins, soft resting tone   Pertinent Labs/Studies:    No results found for this or any previous visit (from the past 24 hour(s)).  Assessment :  Daisy Hancock is a 32 y.o. [phone] at [redacted]w[redacted]d being admitted for labor, Rh positive, GBS negative  FHR Category I  Plan:  Admit to birthing suites, see orders.   Labor: Expectant management.    Delivery plan: Hopeful for vaginal delivery.   Room prepared for second stage.   Dr. Valentino Saxon notified of admission and plan of care.    Gunnar Bulla, CNM [facility], [facility] 12/16/17 4:35 AM

## 2017-12-17 LAB — CBC
HCT: 33.4 % — ABNORMAL LOW (ref 35.0–47.0)
Hemoglobin: 11.7 g/dL — ABNORMAL LOW (ref 12.0–16.0)
MCH: 31.1 pg (ref 26.0–34.0)
MCHC: 34.9 g/dL (ref 32.0–36.0)
MCV: 89.2 fL (ref 80.0–100.0)
Platelets: 228 10*3/uL (ref 150–440)
RBC: 3.75 MIL/uL — ABNORMAL LOW (ref 3.80–5.20)
RDW: 14.7 % — AB (ref 11.5–14.5)
WBC: 11.4 10*3/uL — ABNORMAL HIGH (ref 3.6–11.0)

## 2017-12-17 LAB — RPR: RPR Ser Ql: NONREACTIVE

## 2017-12-17 MED ORDER — ACETAMINOPHEN 325 MG PO TABS: 650.0000 mg | ORAL_TABLET | ORAL | 0 refills | Status: DC | PRN

## 2017-12-17 MED ORDER — IBUPROFEN 600 MG PO TABS: 600.0000 mg | ORAL_TABLET | Freq: Four times a day (QID) | ORAL | 0 refills | Status: DC

## 2017-12-17 NOTE — Progress Notes (Signed)
Discharge instructions provided.  Pt and sig other verbalize understanding of all instructions and follow-up care.  Pt discharged to home with infant at 1050 on 12/17/17 via wheelchair by volunteer. Reynold Bowen, RN 12/17/2017 12:27 PM

## 2017-12-17 NOTE — Discharge Instructions (Signed)
Home Care Instructions for Mom ACTIVITY  Gradually return to your regular activities.  Let yourself rest. Nap while your baby sleeps.  Avoid lifting anything that is heavier than 10 lb (4.5 kg) until your health care provider says it is okay.  Avoid activities that take a lot of effort and energy (are strenuous) until approved by your health care provider. Walking at a slow-to-moderate pace is usually safe.  If you had a cesarean delivery: ? Do not vacuum, climb stairs, or drive a car for 4-6 weeks. ? Have someone help you at home until you feel like you can do your usual activities yourself. ? Do exercises as told by your health care provider, if this applies.  VAGINAL BLEEDING You may continue to bleed for 4-6 weeks after delivery. Over time, the amount of blood usually decreases and the color of the blood usually gets lighter. However, the flow of bright red blood may increase if you have been too active. If you need to use more than one pad in an hour because your pad gets soaked, or if you pass a large clot:  Lie down.  Raise your feet.  Place a cold compress on your lower abdomen.  Rest.  Call your health care provider.  If you are breastfeeding, your period should return anytime between 8 weeks after delivery and the time that you stop breastfeeding. If you are not breastfeeding, your period should return 6-8 weeks after delivery. PERINEAL CARE The perineal area, or perineum, is the part of your body between your thighs. After delivery, this area needs special care. Follow these instructions as told by your health care provider.  Take warm tub baths for 15-20 minutes.  Use medicated pads and pain-relieving sprays and creams as told.  Do not use tampons or douches until vaginal bleeding has stopped.  Each time you go to the bathroom: ? Use a peri bottle. ? Change your pad. ? Use towelettes in place of toilet paper until your stitches have healed.  Do Kegel  exercises every day. Kegel exercises help to maintain the muscles that support the vagina, bladder, and bowels. You can do these exercises while you are standing, sitting, or lying down. To do Kegel exercises: ? Tighten the muscles of your abdomen and the muscles that surround your birth canal. ? Hold for a few seconds. ? Relax. ? Repeat until you have done this 5 times in a row.  To prevent hemorrhoids from developing or getting worse: ? Drink enough fluid to keep your urine clear or pale yellow. ? Avoid straining when having a bowel movement. ? Take over-the-counter medicines and stool softeners as told by your health care provider.  BREAST CARE  Wear a tight-fitting bra.  Avoid taking over-the-counter pain medicine for breast discomfort.  Apply ice to the breasts to help with discomfort as needed: ? Put ice in a plastic bag. ? Place a towel between your skin and the bag. ? Leave the ice on for 20 minutes or as told by your health care provider.  NUTRITION  Eat a well-balanced diet.  Do not try to lose weight quickly by cutting back on calories.  Take your prenatal vitamins until your postpartum checkup or until your health care provider tells you to stop.  POSTPARTUM DEPRESSION You may find yourself crying for no apparent reason and unable to cope with all of the changes that come with having a newborn. This mood is called postpartum depression. Postpartum depression happens because your hormone  levels change after delivery. If you have postpartum depression, get support from your partner, friends, and family. If the depression does not go away on its own after several weeks, contact your health care provider. BREAST SELF-EXAM Do a breast self-exam each month, at the same time of the month. If you are breastfeeding, check your breasts just after a feeding, when your breasts are less full. If you are breastfeeding and your period has started, check your breasts on day 5, 6, or 7 of  your period. Report any lumps, bumps, or discharge to your health care provider. Know that breasts are normally lumpy if you are breastfeeding. This is temporary, and it is not a health risk. INTIMACY AND SEXUALITY Avoid sexual activity for at least 3-4 weeks after delivery or until the brownish-red vaginal flow is completely gone. If you want to avoid pregnancy, use some form of birth control. You can get pregnant after delivery, even if you have not had your period. SEEK MEDICAL CARE IF:  You feel unable to cope with the changes that a child brings to your life, and these feelings do not go away after several weeks.  You notice a lump, a bump, or discharge on your breast.  SEEK IMMEDIATE MEDICAL CARE IF:  Blood soaks your pad in 1 hour or less.  You have: ? Severe pain or cramping in your lower abdomen. ? A bad-smelling vaginal discharge. ? A fever that is not controlled by medicine. ? A fever, and an area of your breast is red and sore. ? Pain or redness in your calf. ? Sudden, severe chest pain. ? Shortness of breath. ? Painful or bloody urination. ? Problems with your vision.  You vomit for 12 hours or longer.  You develop a severe headache.  You have serious thoughts about hurting yourself, your child, or anyone else.  This information is not intended to replace advice given to you by your health care provider. Make sure you discuss any questions you have with your health care provider. Document Released: 03/27/2000 Document Revised: 09/05/2015 Document Reviewed: 10/01/2014 Elsevier Interactive Patient Education  2017 [employer] Postpartum Care After Vaginal Delivery The period of time right after you deliver your newborn is called the postpartum period. What kind of medical care will I receive?  You may continue to receive fluids and medicines through an IV tube inserted into one of your veins.  If an incision was made near your vagina (episiotomy) or if you had  some vaginal tearing during delivery, cold compresses may be placed on your episiotomy or your tear. This helps to reduce pain and swelling.  You may be given a squirt bottle to use when you go to the bathroom. You may use this until you are comfortable wiping as usual. To use the squirt bottle, follow these steps: ? Before you urinate, fill the squirt bottle with warm water. Do not use hot water. ? After you urinate, while you are sitting on the toilet, use the squirt bottle to rinse the area around your urethra and vaginal opening. This rinses away any urine and blood. ? You may do this instead of wiping. As you start healing, you may use the squirt bottle before wiping yourself. Make sure to wipe gently. ? Fill the squirt bottle with clean water every time you use the bathroom.  You will be given sanitary pads to wear. How can I expect to feel?  You may not feel the need to urinate for several hours  after delivery.  You will have some soreness and pain in your abdomen and vagina.  If you are breastfeeding, you may have uterine contractions every time you breastfeed for up to several weeks postpartum. Uterine contractions help your uterus return to its normal size.  It is normal to have vaginal bleeding (lochia) after delivery. The amount and appearance of lochia is often similar to a menstrual period in the first week after delivery. It will gradually decrease over the next few weeks to a dry, yellow-brown discharge. For most women, lochia stops completely by 6-8 weeks after delivery. Vaginal bleeding can vary from woman to woman.  Within the first few days after delivery, you may have breast engorgement. This is when your breasts feel heavy, full, and uncomfortable. Your breasts may also throb and feel hard, tightly stretched, warm, and tender. After this occurs, you may have milk leaking from your breasts.Your health care provider can help you relieve discomfort due to breast engorgement.  Breast engorgement should go away within a few days.  You may feel more sad or worried than normal due to hormonal changes after delivery. These feelings should not last more than a few days. If these feelings do not go away after several days, speak with your health care provider. How should I care for myself?  Tell your health care provider if you have pain or discomfort.  Drink enough water to keep your urine clear or pale yellow.  Wash your hands thoroughly with soap and water for at least 20 seconds after changing your sanitary pads, after using the toilet, and before holding or feeding your baby.  If you are not breastfeeding, avoid touching your breasts a lot. Doing this can make your breasts produce more milk.  If you become weak or lightheaded, or you feel like you might faint, ask for help before: ? Getting out of bed. ? Showering.  Change your sanitary pads frequently. Watch for any changes in your flow, such as a sudden increase in volume, a change in color, the passing of large blood clots. If you pass a blood clot from your vagina, save it to show to your health care provider. Do not flush blood clots down the toilet without having your health care provider look at them.  Make sure that all your vaccinations are up to date. This can help protect you and your baby from getting certain diseases. You may need to have immunizations done before you leave the hospital.  If desired, talk with your health care provider about methods of family planning or birth control (contraception). How can I start bonding with my baby? Spending as much time as possible with your baby is very important. During this time, you and your baby can get to know each other and develop a bond. Having your baby stay with you in your room (rooming in) can give you time to get to know your baby. Rooming in can also help you become comfortable caring for your baby. Breastfeeding can also help you bond with your  baby. How can I plan for returning home with my baby?  Make sure that you have a car seat installed in your vehicle. ? Your car seat should be checked by a certified car seat installer to make sure that it is installed safely. ? Make sure that your baby fits into the car seat safely.  Ask your health care provider any questions you have about caring for yourself or your baby. Make sure that you  are able to contact your health care provider with any questions after leaving the hospital. This information is not intended to replace advice given to you by your health care provider. Make sure you discuss any questions you have with your health care provider. Document Released: 01/25/2007 Document Revised: 09/02/2015 Document Reviewed: 03/04/2015 Elsevier Interactive Patient Education  2018 [employer]. Postpartum Depression and Baby Blues The postpartum period begins right after the birth of a baby. During this time, there is often a great amount of joy and excitement. It is also a time of many changes in the life of the parents. Regardless of how many times a mother gives birth, each child brings new challenges and dynamics to the family. It is not unusual to have feelings of excitement along with confusing shifts in moods, emotions, and thoughts. All mothers are at risk of developing postpartum depression or the "baby blues." These mood changes can occur right after giving birth, or they may occur many months after giving birth. The baby blues or postpartum depression can be mild or severe. Additionally, postpartum depression can go away rather quickly, or it can be a long-term condition. What are the causes? Raised hormone levels and the rapid drop in those levels are thought to be a main cause of postpartum depression and the baby blues. A number of hormones change during and after pregnancy. Estrogen and progesterone usually decrease right after the delivery of your baby. The levels of thyroid  hormone and various cortisol steroids also rapidly drop. Other factors that play a role in these mood changes include major life events and genetics. What increases the risk? If you have any of the following risks for the baby blues or postpartum depression, know what symptoms to watch out for during the postpartum period. Risk factors that may increase the likelihood of getting the baby blues or postpartum depression include:  Having a personal or family history of depression.  Having depression while being pregnant.  Having premenstrual mood issues or mood issues related to oral contraceptives.  Having a lot of life stress.  Having marital conflict.  Lacking a social support network.  Having a baby with special needs.  Having health problems, such as diabetes.  What are the signs or symptoms? Symptoms of baby blues include:  Brief changes in mood, such as going from extreme happiness to sadness.  Decreased concentration.  Difficulty sleeping.  Crying spells, tearfulness.  Irritability.  Anxiety.  Symptoms of postpartum depression typically begin within the first month after giving birth. These symptoms include:  Difficulty sleeping or excessive sleepiness.  Marked weight loss.  Agitation.  Feelings of worthlessness.  Lack of interest in activity or food.  Postpartum psychosis is a very serious condition and can be dangerous. Fortunately, it is rare. Displaying any of the following symptoms is cause for immediate medical attention. Symptoms of postpartum psychosis include:  Hallucinations and delusions.  Bizarre or disorganized behavior.  Confusion or disorientation.  How is this diagnosed? A diagnosis is made by an evaluation of your symptoms. There are no medical or lab tests that lead to a diagnosis, but there are various questionnaires that a health care provider may use to identify those with the baby blues, postpartum depression, or psychosis. Often, a  screening tool called the [place] Postnatal Depression Scale is used to diagnose depression in the postpartum period. How is this treated? The baby blues usually goes away on its own in 1-2 weeks. Social support is often all that is needed.  You will be encouraged to get adequate sleep and rest. Occasionally, you may be given medicines to help you sleep. Postpartum depression requires treatment because it can last several months or longer if it is not treated. Treatment may include individual or group therapy, medicine, or both to address any social, physiological, and psychological factors that may play a role in the depression. Regular exercise, a healthy diet, rest, and social support may also be strongly recommended. Postpartum psychosis is more serious and needs treatment right away. Hospitalization is often needed. Follow these instructions at home:  Get as much rest as you can. Nap when the baby sleeps.  Exercise regularly. Some women find yoga and walking to be beneficial.  Eat a balanced and nourishing diet.  Do little things that you enjoy. Have a cup of tea, take a bubble bath, read your favorite magazine, or listen to your favorite music.  Avoid alcohol.  Ask for help with household chores, cooking, grocery shopping, or running errands as needed. Do not try to do everything.  Talk to people close to you about how you are feeling. Get support from your partner, family members, friends, or other new moms.  Try to stay positive in how you think. Think about the things you are grateful for.  Do not spend a lot of time alone.  Only take over-the-counter or prescription medicine as directed by your health care provider.  Keep all your postpartum appointments.  Let your health care provider know if you have any concerns. Contact a health care provider if: You are having a reaction to or problems with your medicine. Get help right away if:  You have suicidal feelings.  You  think you may harm the baby or someone else. This information is not intended to replace advice given to you by your health care provider. Make sure you discuss any questions you have with your health care provider. Document Released: 01/02/2004 Document Revised: 09/05/2015 Document Reviewed: 01/09/2013 Elsevier Interactive Patient Education  2017 [employer]. Breastfeeding Challenges and Solutions Even though breastfeeding is natural, it can be challenging, especially in the first few weeks after childbirth. It is normal for problems to arise when starting to breastfeed your new baby, even if you have breastfed before. This document provides some solutions to the most common breastfeeding challenges. Challenges and solutions Challenge--Cracked or Sore Nipples Cracked or sore nipples are commonly experienced by breastfeeding mothers. Cracked or sore nipples often are caused by inadequate latching (when your baby's mouth attaches to your breast to breastfeed). Soreness can also happen if your baby is not positioned properly at your breast. Although nipple cracking and soreness are common during the first week after birth, nipple pain is never normal. If you experience nipple cracking or soreness that lasts longer than 1 week or nipple pain, call your health care provider or lactation consultant. Solution Ensure proper latching and positioning of your baby by following the steps below:  Find a comfortable place to sit or lie down, with your neck and back well supported.  Place a pillow or rolled up blanket under your baby to bring him or her to the level of your breast (if you are seated).  Make sure that your baby's abdomen is facing your abdomen.  Gently massage your breast. With your fingertips, massage from your chest wall toward your nipple in a circular motion. This encourages milk flow. You may need to continue this action during the feeding if your milk flows slowly.  Support your  breast  with 4 fingers underneath and your thumb above your nipple. Make sure your fingers are well away from your nipple and your babys mouth.  Stroke your baby's lips gently with your finger or nipple.  When your baby's mouth is open wide enough, quickly bring your baby to your breast, placing your entire nipple and as much of the colored area around your nipple (areola) as possible into your baby's mouth. ? More areola should be visible above your baby's upper lip than below the lower lip. ? Your baby's tongue should be between his or her lower gum and your breast.  Ensure that your baby's mouth is correctly positioned around your nipple (latched). Your baby's lips should create a seal on your breast and be turned out (everted).  It is common for your baby to suck for about 2-3 minutes in order to start the flow of breast milk.  Signs that your baby has successfully latched on to your nipple include:  Quietly tugging or quietly sucking without causing you pain.  Swallowing heard between every 3-4 sucks.  Muscle movement above and in front of his or her ears with sucking.  Signs that your baby has not successfully latched on to nipple include:  Sucking sounds or smacking sounds from your baby while nursing.  Nipple pain.  Ensure that your breasts stay moisturized and healthy by:  Avoiding the use of soap on your nipples.  Wearing a supportive bra. Avoid wearing underwire-style bras or tight bras.  Air drying your nipples for 3-4 minutes after each feeding.  Using only cotton bra pads to absorb breast milk leakage. Leaking of breast milk between feedings is normal. Be sure to change the pads if they become soaked with milk.  Using lanolin on your nipples after nursing. Lanolin helps to maintain your skin's normal moisture barrier. If you use pure lanolin you do not need to wash it off before feeding your baby again. Pure lanolin is not toxic to your baby. You may also hand express a few  drops of breast milk and gently massage that milk into your nipples, allowing it to air dry.  Challenge--Breast Engorgement Breast engorgement is the overfilling of your breasts with breast milk. In the first few weeks after giving birth, you may experience breast engorgement. Breast engorgement can make your breasts throb and feel hard, tightly stretched, warm, and tender. Engorgement peaks about the fifth day after you give birth. Having breast engorgement does not mean you have to stop breastfeeding your baby. Solution  Breastfeed when you feel the need to reduce the fullness of your breasts or when your baby shows signs of hunger. This is called "breastfeeding on demand."  Newborns (babies younger than 4 weeks) often breastfeed every 1-3 hours during the day. You may need to awaken your baby to feed if he or she is asleep at a feeding time.  Do not allow your baby to sleep longer than 5 hours during the night without a feeding.  Pump or hand express breast milk before breastfeeding to soften your breast, areola, and nipple.  Apply warm, moist heat (in the shower or with warm water-soaked hand towels) just before feeding or pumping, or massage your breast before or during breastfeeding. This increases circulation and helps your milk to flow.  Completely empty your breasts when breastfeeding or pumping. Afterward, wear a snug bra (nursing or regular) or tank top for 1-2 days to signal your body to slightly decrease milk production. Only wear snug  bras or tank tops to treat engorgement. Tight bras typically should be avoided by breastfeeding mothers. Once engorgement is relieved, return to wearing regular, loose-fitting clothes.  Apply ice packs to your breasts to lessen the pain from engorgement and relieve swelling, unless the ice is uncomfortable for you.  Do not delay feedings. Try to relax when it is time to feed your baby. This helps to trigger your "let-down reflex," which releases milk  from your breast.  Ensure your baby is latched on to your breast and positioned properly while breastfeeding.  Allow your baby to remain at your breast as long as he or she is latched on well and actively sucking. Your baby will let you know when he or she is done breastfeeding by pulling away from your breast or falling asleep.  Avoid introducing bottles or pacifiers to your baby in the early weeks of breastfeeding. Wait to introduce these things until after resolving any breastfeeding challenges.  Try to pump your milk on the same schedule as when your baby would breastfeed if you are returning to work or away from home for an extended period.  Drink plenty of fluids to avoid dehydration, which can eventually put you at greater risk of breast engorgement.  If you follow these suggestions, your engorgement should improve in 24-48 hours. If you are still experiencing difficulty, call your lactation consultant or health care provider. Challenge--Plugged Milk Ducts Plugged milk ducts occur when the duct does not drain milk effectively and becomes swollen. Wearing a tight-fitting nursing bra or having difficulty with latching may cause plugged milk ducts. Not drinking enough water (8-10 c [1.9-2.4 L] per day) can contribute to plugged milk ducts. Once a duct has become plugged, hard lumps, soreness, and redness may develop in your breast. Solution Do not delay feedings. Feed your baby frequently and try to empty your breasts of milk at each feeding. Try breastfeeding from the affected side first so there is a better chance that the milk will drain completely from that breast. Apply warm, moist towels to your breasts for 5-10 minutes before feeding. Alternatively, a hot shower right before breastfeeding can provide the moist heat that can encourage milk flow. Gentle massage of the sore area before and during a feeding may also help. Avoid wearing tight clothing or bras that put pressure on your breasts.  Wear bras that offer good support to your breasts, but avoid underwire bras. If you have a plugged milk duct and develop a fever, you need to see your health care provider. Challenge--Mastitis Mastitis is inflammation of your breast. It usually is caused by a bacterial infection and can cause flu-like symptoms. You may develop redness in your breast and a fever. Often when mastitis occurs, your breast becomes firm, warm, and very painful. The most common causes of mastitis are poor latching, ineffective sucking from your baby, consistent pressure on your breast (possibly from wearing a tight-fitting bra or shirt that restricts the milk flow), unusual stress or fatigue, or missed feedings. Solution You will be given antibiotic medicine to treat the infection. It is still important to breastfeed frequently to empty your breasts. Continuing to breastfeed while you recover from mastitis will not harm your baby. Make sure your baby is positioned properly during every feeding. Apply moist heat to your breasts for a few minutes before feeding to help the milk flow and to help your breasts empty more easily. Challenge--Thrush Ginette Pitman is a yeast infection that can form on your nipples, in your  breast, or in your baby's mouth. It causes itching, soreness, burning or stabbing pain, and sometimes a rash. Solution You will be given a medicated ointment for your nipples, and your baby will be given a liquid medicine for his or her mouth. It is important that you and your baby are treated at the same time because thrush can be passed between you and your baby. Change disposable nursing pads often. Any bras, towels, or clothing that come in contact with infected areas of your body or your baby's body need to be washed in very hot water every day. Wash your hands and your baby's hands often. All pacifiers, bottle nipples, or toys your baby puts in his or her mouth should be boiled once a day for 20 minutes. After 1 week of  treatment, discard pacifiers and bottle nipples and buy new ones. All breast pump parts that touch the milk need to be boiled for 20 minutes every day. Challenge--Low Milk Supply You may not be producing enough milk if your baby is not gaining the proper amount of weight. Breast milk production is based on a supply-and-demand system. Your milk supply depends on how frequently and effectively your baby empties your breast. Solution The more you breastfeed and pump, the more breast milk you will produce. It is important that your baby empties at least one of your breasts at each feeding. If this is not happening, then use a breast pump or hand express any milk that remains. This will help to drain as much milk as possible at each feeding. It will also signal your body to produce more milk. If your baby is not emptying your breasts, it may be due to latching, sucking, or positioning problems. If low milk supply continues after addressing these issues, contact your health care provider or a lactation specialist as soon as possible. Challenge--Inverted or Flat Nipples Some women have nipples that turn inward instead of protruding outward. Other women have nipples that are flat. Inverted or flat nipples can sometimes make it more difficult for your baby to latch onto your breast. Solution You may be given a small device that pulls out inverted nipples. This device should be applied right before your baby is brought to your breast. You can also try using a breast pump for a short time before placing the baby at your breast. The pump can pull your nipple outwards to help your infant latch more easily. The baby's sucking motion will help the inverted nipple protrude as well. If you have flat nipples, encourage your baby to latch onto your breast and feed frequently in the early days after birth. This will give your baby practice latching on correctly while your breast is still soft. When your milk supply increases,  between the second and fifth day after birth and your breasts become full, your baby will have an easier time latching. Contact a lactation consultant if you still have concerns. She or he can teach you additional techniques to address breastfeeding problems related to nipple shape and position. Where to find more information: [employer]: www.llli.org This information is not intended to replace advice given to you by your health care provider. Make sure you discuss any questions you have with your health care provider. Document Released: 09/21/2005 Document Revised: 09/11/2015 Document Reviewed: 09/23/2012 Elsevier Interactive Patient Education  2017 [employer]. Breastfeeding Choosing to breastfeed is one of the best decisions you can make for yourself and your baby. A change in hormones during  pregnancy causes your breasts to make breast milk in your milk-producing glands. Hormones prevent breast milk from being released before your baby is born. They also prompt milk flow after birth. Once breastfeeding has begun, thoughts of your baby, as well as his or her sucking or crying, can stimulate the release of milk from your milk-producing glands. Benefits of breastfeeding Research shows that breastfeeding offers many health benefits for infants and mothers. It also offers a cost-free and convenient way to feed your baby. For your baby  Your first milk (colostrum) helps your baby's digestive system to function better.  Special cells in your milk (antibodies) help your baby to fight off infections.  Breastfed babies are less likely to develop asthma, allergies, obesity, or type 2 diabetes. They are also at lower risk for sudden infant death syndrome (SIDS).  Nutrients in breast milk are better able to meet your babys needs compared to infant formula.  Breast milk improves your baby's brain development. For you  Breastfeeding helps to create a very special bond between you  and your baby.  Breastfeeding is convenient. Breast milk costs nothing and is always available at the correct temperature.  Breastfeeding helps to burn calories. It helps you to lose the weight that you gained during pregnancy.  Breastfeeding makes your uterus return faster to its size before pregnancy. It also slows bleeding (lochia) after you give birth.  Breastfeeding helps to lower your risk of developing type 2 diabetes, osteoporosis, rheumatoid arthritis, cardiovascular disease, and breast, ovarian, uterine, and endometrial cancer later in life. Breastfeeding basics Starting breastfeeding  Find a comfortable place to sit or lie down, with your neck and back well-supported.  Place a pillow or a rolled-up blanket under your baby to bring him or her to the level of your breast (if you are seated). Nursing pillows are specially designed to help support your arms and your baby while you breastfeed.  Make sure that your baby's tummy (abdomen) is facing your abdomen.  Gently massage your breast. With your fingertips, massage from the outer edges of your breast inward toward the nipple. This encourages milk flow. If your milk flows slowly, you may need to continue this action during the feeding.  Support your breast with 4 fingers underneath and your thumb above your nipple (make the letter "C" with your hand). Make sure your fingers are well away from your nipple and your babys mouth.  Stroke your baby's lips gently with your finger or nipple.  When your baby's mouth is open wide enough, quickly bring your baby to your breast, placing your entire nipple and as much of the areola as possible into your baby's mouth. The areola is the colored area around your nipple. ? More areola should be visible above your baby's upper lip than below the lower lip. ? Your baby's lips should be opened and extended outward (flanged) to ensure an adequate, comfortable latch. ? Your baby's tongue should be  between his or her lower gum and your breast.  Make sure that your baby's mouth is correctly positioned around your nipple (latched). Your baby's lips should create a seal on your breast and be turned out (everted).  It is common for your baby to suck about 2-3 minutes in order to start the flow of breast milk. Latching Teaching your baby how to latch onto your breast properly is very important. An improper latch can cause nipple pain, decreased milk supply, and poor weight gain in your baby. Also, if your baby  is not latched onto your nipple properly, he or she may swallow some air during feeding. This can make your baby fussy. Burping your baby when you switch breasts during the feeding can help to get rid of the air. However, teaching your baby to latch on properly is still the best way to prevent fussiness from swallowing air while breastfeeding. Signs that your baby has successfully latched onto your nipple  Silent tugging or silent sucking, without causing you pain. Infant's lips should be extended outward (flanged).  Swallowing heard between every 3-4 sucks once your milk has started to flow (after your let-down milk reflex occurs).  Muscle movement above and in front of his or her ears while sucking.  Signs that your baby has not successfully latched onto your nipple  Sucking sounds or smacking sounds from your baby while breastfeeding.  Nipple pain.  If you think your baby has not latched on correctly, slip your finger into the corner of your babys mouth to break the suction and place it between your baby's gums. Attempt to start breastfeeding again. Signs of successful breastfeeding Signs from your baby  Your baby will gradually decrease the number of sucks or will completely stop sucking.  Your baby will fall asleep.  Your baby's body will relax.  Your baby will retain a small amount of milk in his or her mouth.  Your baby will let go of your breast by himself or  herself.  Signs from you  Breasts that have increased in firmness, weight, and size 1-3 hours after feeding.  Breasts that are softer immediately after breastfeeding.  Increased milk volume, as well as a change in milk consistency and color by the fifth day of breastfeeding.  Nipples that are not sore, cracked, or bleeding.  Signs that your baby is getting enough milk  Wetting at least 1-2 diapers during the first 24 hours after birth.  Wetting at least 5-6 diapers every 24 hours for the first week after birth. The urine should be clear or pale yellow by the age of 5 days.  Wetting 6-8 diapers every 24 hours as your baby continues to grow and develop.  At least 3 stools in a 24-hour period by the age of 5 days. The stool should be soft and yellow.  At least 3 stools in a 24-hour period by the age of 7 days. The stool should be seedy and yellow.  No loss of weight greater than 10% of birth weight during the first 3 days of life.  Average weight gain of 4-7 oz (113-198 g) per week after the age of 4 days.  Consistent daily weight gain by the age of 5 days, without weight loss after the age of 2 weeks. After a feeding, your baby may spit up a small amount of milk. This is normal. Breastfeeding frequency and duration Frequent feeding will help you make more milk and can prevent sore nipples and extremely full breasts (breast engorgement). Breastfeed when you feel the need to reduce the fullness of your breasts or when your baby shows signs of hunger. This is called "breastfeeding on demand." Signs that your baby is hungry include:  Increased alertness, activity, or restlessness.  Movement of the head from side to side.  Opening of the mouth when the corner of the mouth or cheek is stroked (rooting).  Increased sucking sounds, smacking lips, cooing, sighing, or squeaking.  Hand-to-mouth movements and sucking on fingers or hands.  Fussing or crying.  Avoid introducing a  pacifier to your baby in the first 4-6 weeks after your baby is born. After this time, you may choose to use a pacifier. Research has shown that pacifier use during the first year of a baby's life decreases the risk of sudden infant death syndrome (SIDS). Allow your baby to feed on each breast as long as he or she wants. When your baby unlatches or falls asleep while feeding from the first breast, offer the second breast. Because newborns are often sleepy in the first few weeks of life, you may need to awaken your baby to get him or her to feed. Breastfeeding times will vary from baby to baby. However, the following rules can serve as a guide to help you make sure that your baby is properly fed:  Newborns (babies 34 weeks of age or younger) may breastfeed every 1-3 hours.  Newborns should not go without breastfeeding for longer than 3 hours during the day or 5 hours during the night.  You should breastfeed your baby a minimum of 8 times in a 24-hour period.  Breast milk pumping Pumping and storing breast milk allows you to make sure that your baby is exclusively fed your breast milk, even at times when you are unable to breastfeed. This is especially important if you go back to work while you are still breastfeeding, or if you are not able to be present during feedings. Your lactation consultant can help you find a method of pumping that works best for you and give you guidelines about how long it is safe to store breast milk. Caring for your breasts while you breastfeed Nipples can become dry, cracked, and sore while breastfeeding. The following recommendations can help keep your breasts moisturized and healthy:  Avoid using soap on your nipples.  Wear a supportive bra designed especially for nursing. Avoid wearing underwire-style bras or extremely tight bras (sports bras).  Air-dry your nipples for 3-4 minutes after each feeding.  Use only cotton bra pads to absorb leaked breast milk. Leaking  of breast milk between feedings is normal.  Use lanolin on your nipples after breastfeeding. Lanolin helps to maintain your skin's normal moisture barrier. Pure lanolin is not harmful (not toxic) to your baby. You may also hand express a few drops of breast milk and gently massage that milk into your nipples and allow the milk to air-dry.  In the first few weeks after giving birth, some women experience breast engorgement. Engorgement can make your breasts feel heavy, warm, and tender to the touch. Engorgement peaks within 3-5 days after you give birth. The following recommendations can help to ease engorgement:  Completely empty your breasts while breastfeeding or pumping. You may want to start by applying warm, moist heat (in the shower or with warm, water-soaked hand towels) just before feeding or pumping. This increases circulation and helps the milk flow. If your baby does not completely empty your breasts while breastfeeding, pump any extra milk after he or she is finished.  Apply ice packs to your breasts immediately after breastfeeding or pumping, unless this is too uncomfortable for you. To do this: ? Put ice in a plastic bag. ? Place a towel between your skin and the bag. ? Leave the ice on for 20 minutes, 2-3 times a day.  Make sure that your baby is latched on and positioned properly while breastfeeding.  If engorgement persists after 48 hours of following these recommendations, contact your health care provider or a Advertising copywriter. Overall health care  recommendations while breastfeeding  Eat 3 healthy meals and 3 snacks every day. Well-nourished mothers who are breastfeeding need an additional 450-500 calories a day. You can meet this requirement by increasing the amount of a balanced diet that you eat.  Drink enough water to keep your urine pale yellow or clear.  Rest often, relax, and continue to take your prenatal vitamins to prevent fatigue, stress, and low vitamin and  mineral levels in your body (nutrient deficiencies).  Do not use any products that contain nicotine or tobacco, such as cigarettes and e-cigarettes. Your baby may be harmed by chemicals from cigarettes that pass into breast milk and exposure to secondhand smoke. If you need help quitting, ask your health care provider.  Avoid alcohol.  Do not use illegal drugs or marijuana.  Talk with your health care provider before taking any medicines. These include over-the-counter and prescription medicines as well as vitamins and herbal supplements. Some medicines that may be harmful to your baby can pass through breast milk.  It is possible to become pregnant while breastfeeding. If birth control is desired, ask your health care provider about options that will be safe while breastfeeding your baby. Where to find more information: [employer]: www.llli.org Contact a health care provider if:  You feel like you want to stop breastfeeding or have become frustrated with breastfeeding.  Your nipples are cracked or bleeding.  Your breasts are red, tender, or warm.  You have: ? Painful breasts or nipples. ? A swollen area on either breast. ? A fever or chills. ? Nausea or vomiting. ? Drainage other than breast milk from your nipples.  Your breasts do not become full before feedings by the fifth day after you give birth.  You feel sad and depressed.  Your baby is: ? Too sleepy to eat well. ? Having trouble sleeping. ? More than 36 week old and wetting fewer than 6 diapers in a 24-hour period. ? Not gaining weight by 36 days of age.  Your baby has fewer than 3 stools in a 24-hour period.  Your baby's skin or the white parts of his or her eyes become yellow. Get help right away if:  Your baby is overly tired (lethargic) and does not want to wake up and feed.  Your baby develops an unexplained fever. Summary  Breastfeeding offers many health benefits for infant and  mothers.  Try to breastfeed your infant when he or she shows early signs of hunger.  Gently tickle or stroke your baby's lips with your finger or nipple to allow the baby to open his or her mouth. Bring the baby to your breast. Make sure that much of the areola is in your baby's mouth. Offer one side and burp the baby before you offer the other side.  Talk with your health care provider or lactation consultant if you have questions or you face problems as you breastfeed. This information is not intended to replace advice given to you by your health care provider. Make sure you discuss any questions you have with your health care provider. Document Released: 03/30/2005 Document Revised: 05/01/2016 Document Reviewed: 05/01/2016 Elsevier Interactive Patient Education  2018 [employer].  Call your doctor for increased pain or vaginal bleeding, temperature above 100.4, depression, or concerns.  Increase calories and fluids while breastfeeding.  Continue prenatal vitamin and iron.  No strenuous activity or heavy lifting for 6 weeks.  No intercourse, tampons, or douching for 6 weeks.  No tub baths showers  only.  No driving for 2 weeks.

## 2017-12-17 NOTE — Discharge Summary (Signed)
   Obstetric Discharge Summary  Patient ID: Daisy Hancock MRN: [id number] DOB/AGE: Oct 12, 1985 32 y.o.   Date of Admission: 12/16/2017  Date of Discharge:  12/17/17  Admitting Diagnosis: Onset of Labor at [redacted]w[redacted]d  Secondary Diagnosis: Thyroid disease, History anxiety and depression  Mode of Delivery: normal spontaneous vaginal delivery     Discharge Diagnosis: No other diagnosis   Intrapartum Procedures: None   Post partum procedures: None  Complications: First degree perineal laceration   Brief Hospital Course   Daisy Hancock is a [id number] who had a SVD on 12/16/2017;  for further details of this surgery, please refer to the delivey note.  Patient had an uncomplicated postpartum course.  By time of discharge on PPD#1, her pain was controlled on oral pain medications; she had appropriate lochia and was ambulating, voiding without difficulty and tolerating regular diet.  She was deemed stable for discharge to home.    Labs: CBC Latest Ref Rng & Units 12/17/2017 12/16/2017 09/23/2017  WBC 3.6 - 11.0 K/uL 11.4(H) 16.9(H) 9.1  Hemoglobin 12.0 - 16.0 g/dL 11.7(L) 12.6 12.7  Hematocrit 35.0 - 47.0 % 33.4(L) 35.9 36.0  Platelets 150 - 440 K/uL 228 237 247   A POS  Physical exam:   Temp:  [98.2 F (36.8 C)-98.9 F (37.2 C)] 98.2 F (36.8 C) (09/05 2000) Pulse Rate:  [78-85] 78 (09/05 2000) Resp:  [16-18] 18 (09/05 2000) BP: (96-103)/(60-73) 103/73 (09/05 2000) SpO2:  [98 %] 98 % (09/05 1543)  General: alert and no distress  Lochia: appropriate  Abdomen: soft, NT  Uterine Fundus: firm  Perineum: healing well, no significant drainage, no dehiscence, no significant erythema  Extremities: No evidence of DVT seen on physical exam  Discharge Instructions: Per After Visit Summary.  Activity: Advance as tolerated. Pelvic rest for 6 weeks.  Also refer to After Visit Summary  Diet: Regular  Medications: Allergies as of 12/17/2017      Reactions   Ceclor [cefaclor]     Prednisone Other (See Comments)   suicidal   Sulfa Antibiotics    GI issue      Medication List    TAKE these medications   acetaminophen 325 MG tablet Commonly known as:  TYLENOL Take 2 tablets (650 mg total) by mouth every 4 (four) hours as needed (for pain scale < 4).   ibuprofen 600 MG tablet Commonly known as:  ADVIL,MOTRIN Take 1 tablet (600 mg total) by mouth every 6 (six) hours.   loratadine 10 MG tablet Commonly known as:  CLARITIN Take 10 mg by mouth daily.   SYNTHROID 50 MCG tablet Generic drug:  levothyroxine TAKE 1 TABLET DAILY   Vitamin D 2000 units tablet Take 2,000 Units by mouth daily.      Outpatient follow up:  Follow-up Information    Gunnar Bulla, CNM. Call.   Specialties:  Certified Nurse Midwife, Obstetrics and Gynecology, Radiology Why:  Please call office to schedule six (6) week postpartum visit. May schedule with Melody if desired.  Contact information: [address] [place] [address] [phone]          Postpartum contraception: vasectomy  Discharged Condition: stable  Discharged to: home   Newborn Data:  Disposition:home with mother  Apgars: APGAR (1 MIN): 6   APGAR (5 MINS): 9    Baby Feeding: Breast   Gunnar Bulla, CNM [facility], [facility] 12/17/17 7:47 AM

## 2017-12-17 NOTE — Progress Notes (Signed)
Patient ID: Daisy Hancock, female   DOB: [birth year], 32 y.o.   MRN: [id number]  Post Partum Day # 1, spontaneous vaginal birth with repaired first degree laceration  Subjective:  Patient doing well, desires discharge today. Infant at bedside in bassinet.   Denies difficulty breathing or respiratory distress, chest pain, abdominal pain, excessive vaginal bleeding, dysuria, and leg pain or swelling.   Objective: Temp:  [98.2 F (36.8 C)-98.9 F (37.2 C)] 98.2 F (36.8 C) (09/05 2000) Pulse Rate:  [78-85] 78 (09/05 2000) Resp:  [16-18] 18 (09/05 2000) BP: (96-103)/(60-73) 103/73 (09/05 2000) SpO2:  [98 %] 98 % (09/05 1543)  Physical Exam:   General: alert and cooperative   Lungs: clear to auscultation bilaterally  Breasts: deferred, no complaints  Heart: normal apical impulse  Abdomen: soft, non-tender; bowel sounds normal; no masses,  no organomegaly  Pelvis: Lochia: appropriate, Uterine Fundus: firm  Extremities: DVT Evaluation: No evidence of DVT seen on physical exam.  Recent Labs    12/16/17 0502 12/17/17 0541  HGB 12.6 11.7*  HCT 35.9 33.4*    Assessment:  32 year old G4P2, postpartum day #1, status post spontaneous vaginal birth with repaired first degree laceration, Rh positive  Breastfeeding  Plan:  Routine postpartum care and education.   Continue orders as written. Reassess as needed.   Will discharge hope today if infant discharged as well.    LOS: 1 day    Gunnar Bulla, CNM [facility], [facility] 12/17/2017 7:30 AM

## 2018-01-27 ENCOUNTER — Ambulatory Visit (INDEPENDENT_AMBULATORY_CARE_PROVIDER_SITE_OTHER): Admitting: Obstetrics and Gynecology

## 2018-01-27 ENCOUNTER — Encounter: Payer: Self-pay | Admitting: Obstetrics and Gynecology

## 2018-01-27 MED ORDER — DESOGESTREL-ETHINYL ESTRADIOL 0.15-0.02/0.01 MG (21/5) PO TABS: 1.0000 | ORAL_TABLET | Freq: Every day | ORAL | 11 refills | Status: DC

## 2018-01-27 NOTE — Progress Notes (Signed)
  Subjective:     Daisy Hancock is a 32 y.o. female who presents for a postpartum visit. She is 6 weeks postpartum following a spontaneous vaginal delivery. I have fully reviewed the prenatal and intrapartum course. The delivery was at 39 gestational weeks. Outcome: spontaneous vaginal delivery. Anesthesia: none. Postpartum course has been uncomplicated. Baby's course has been uncomplicated. Baby is feeding by formula. Bleeding no bleeding. Bowel function is normal. Bladder function is normal. Patient is not sexually active. Contraception method is abstinence. Postpartum depression screening: negative.  The following portions of the patient's history were reviewed and updated as appropriate: allergies, current medications, past family history, past medical history, past social history, past surgical history and problem list.  Review of Systems A comprehensive review of systems was negative.   Objective:    BP 109/69   Pulse 75   Ht 5\' 5"  (1.651 m)   Wt 192 lb 6.4 oz (87.3 kg)   Breastfeeding? No   BMI 32.02 kg/m   General:  alert, cooperative and appears stated age   Breasts:  inspection negative, no nipple discharge or bleeding, no masses or nodularity palpable  Lungs: clear to auscultation bilaterally  Heart:  regular rate and rhythm, S1, S2 normal, no murmur, click, rub or gallop  Abdomen: soft, non-tender; bowel sounds normal; no masses,  no organomegaly   Vulva:  normal  Vagina: normal vagina, no discharge, exudate, lesion, or erythema  Cervix:  multiparous appearance  Corpus: normal size, contour, position, consistency, mobility, non-tender  Adnexa:  no mass, fullness, tenderness  Rectal Exam: Not performed.        Assessment:     6 weeks postpartum exam. Pap smear not done at today's visit.   Plan:    1. Contraception: vasectomy  And OCPs 2. Labs obtained- will follow up accordingly 3. Follow up in: 3 months or as needed.

## 2018-01-28 ENCOUNTER — Encounter: Admitting: Obstetrics and Gynecology

## 2018-01-28 LAB — CBC WITH DIFFERENTIAL/PLATELET
BASOS ABS: 0.1 10*3/uL (ref 0.0–0.2)
Basos: 1 %
EOS (ABSOLUTE): 0.3 10*3/uL (ref 0.0–0.4)
Eos: 3 %
Hematocrit: 40.6 % (ref 34.0–46.6)
Hemoglobin: 13.3 g/dL (ref 11.1–15.9)
IMMATURE GRANS (ABS): 0 10*3/uL (ref 0.0–0.1)
Immature Granulocytes: 1 %
LYMPHS: 32 %
Lymphocytes Absolute: 2.8 10*3/uL (ref 0.7–3.1)
MCH: 28.9 pg (ref 26.6–33.0)
MCHC: 32.8 g/dL (ref 31.5–35.7)
MCV: 88 fL (ref 79–97)
Monocytes Absolute: 0.6 10*3/uL (ref 0.1–0.9)
Monocytes: 7 %
NEUTROS ABS: 4.9 10*3/uL (ref 1.4–7.0)
Neutrophils: 56 %
PLATELETS: 309 10*3/uL (ref 150–450)
RBC: 4.6 x10E6/uL (ref 3.77–5.28)
RDW: 13.7 % (ref 12.3–15.4)
WBC: 8.7 10*3/uL (ref 3.4–10.8)

## 2018-01-28 LAB — FERRITIN: FERRITIN: 33 ng/mL (ref 15–150)

## 2018-01-28 LAB — VITAMIN D 25 HYDROXY (VIT D DEFICIENCY, FRACTURES): VIT D 25 HYDROXY: 45.8 ng/mL (ref 30.0–100.0)

## 2018-02-07 ENCOUNTER — Other Ambulatory Visit

## 2018-02-07 DIAGNOSIS — R5383 Other fatigue: Secondary | ICD-10-CM

## 2018-02-08 LAB — THYROID PANEL WITH TSH
Free Thyroxine Index: 1.8 (ref 1.2–4.9)
T3 Uptake Ratio: 28 % (ref 24–39)
T4 TOTAL: 6.6 ug/dL (ref 4.5–12.0)
TSH: 1.25 u[IU]/mL (ref 0.450–4.500)

## 2018-03-17 ENCOUNTER — Other Ambulatory Visit: Payer: Self-pay | Admitting: *Deleted

## 2018-03-17 MED ORDER — NORGESTIMATE-ETH ESTRADIOL 0.25-35 MG-MCG PO TABS: 1.0000 | ORAL_TABLET | Freq: Every day | ORAL | 11 refills | Status: DC

## 2018-03-17 MED ORDER — SPRINTEC 28 0.25-35 MG-MCG PO TABS: 1.0000 | ORAL_TABLET | Freq: Every day | ORAL | 11 refills | Status: DC

## 2018-03-18 ENCOUNTER — Other Ambulatory Visit: Payer: Self-pay

## 2018-03-22 ENCOUNTER — Other Ambulatory Visit: Payer: Self-pay | Admitting: Obstetrics and Gynecology

## 2018-03-31 ENCOUNTER — Ambulatory Visit
Admission: EM | Admit: 2018-03-31 | Discharge: 2018-03-31 | Disposition: A | Discharge status: Home / Self Care | Attending: Family Medicine | Admitting: Family Medicine

## 2018-03-31 ENCOUNTER — Encounter: Payer: Self-pay | Admitting: Emergency Medicine

## 2018-03-31 ENCOUNTER — Other Ambulatory Visit: Payer: Self-pay

## 2018-03-31 DIAGNOSIS — M545 Low back pain, unspecified: Secondary | ICD-10-CM

## 2018-03-31 LAB — URINALYSIS, COMPLETE (UACMP) WITH MICROSCOPIC
BILIRUBIN URINE: NEGATIVE
Glucose, UA: NEGATIVE mg/dL
Hgb urine dipstick: NEGATIVE
KETONES UR: NEGATIVE mg/dL
NITRITE: NEGATIVE
PH: 6.5 (ref 5.0–8.0)
Protein, ur: NEGATIVE mg/dL
RBC / HPF: NONE SEEN RBC/hpf (ref 0–5)
SPECIFIC GRAVITY, URINE: 1.02 (ref 1.005–1.030)

## 2018-03-31 MED ORDER — CYCLOBENZAPRINE HCL 10 MG PO TABS: 10.0000 mg | ORAL_TABLET | Freq: Two times a day (BID) | ORAL | 0 refills | Status: DC | PRN

## 2018-03-31 NOTE — Discharge Instructions (Addendum)
Take medication as prescribed. Rest. Drink plenty of fluids.  Over-the-counter ibuprofen as discussed.  Follow up with your primary care physician this week as needed. Return to Urgent care for new or worsening concerns.

## 2018-03-31 NOTE — ED Triage Notes (Signed)
Patient c/o flank pain that started 2-3 days ago. Denies urinary symptoms. Patient does report taking OTC Cranberry pills.

## 2018-03-31 NOTE — ED Provider Notes (Signed)
[facility] ____________________________________________  Time seen: Approximately 11:51 AM  I have reviewed the triage vital signs and the nursing notes.   HISTORY  Chief Complaint Flank Pain   HPI Daisy Hancock is a 32 y.o. female presenting with family bedside for evaluation of "flank pain".  Patient points to low back.  States pain is been present for the last few days.  Right side is worse than left.  Denies any radiation to legs, paresthesias, urinary or bowel retention or incontinence.  States the pain does go somewhat towards her sides in her back but not to her abdomen.  States pain is better when she is standing, but worse with position changes.  States pain is somewhat catching when she moves.  Denies any urinary frequency, urgency, burning with urination, vaginal complaints or abdominal pain.  No accompanying chest pain or shortness of breath or fevers.  Denies history of recurrent back pain.  She is 3 months postpartum vaginal delivery that she reports is uncomplicated.  Denies any chance of current pregnancy.  She is not nursing.  Denies alleviating measures.  Denies other aggravating factors.  Reports otherwise doing well.  Denies any fall or injury.  Does report she has been caring her 3928-month-old around a lot who weighs 17 pounds.   Past Medical History:  Diagnosis Date  . Anxiety   . Depression   . Thyroid disease   . Vitamin D deficiency     Patient Active Problem List   Diagnosis Date Noted  . Obesity (BMI 30-39.9) 07/30/2015    Past Surgical History:  Procedure Laterality Date  . NO PAST SURGERIES       No current facility-administered medications for this encounter.   Current Outpatient Medications:  .  Cholecalciferol (VITAMIN D) 2000 units tablet, Take 2,000 Units by mouth daily., Disp: , Rfl:  .  COLLAGEN PO, Take by mouth., Disp: , Rfl:  .  loratadine (CLARITIN) 10 MG tablet, Take 10 mg by mouth daily., Disp: , Rfl:  .  SPRINTEC  28 0.25-35 MG-MCG tablet, Take 1 tablet by mouth daily., Disp: 3 Package, Rfl: 11 .  SYNTHROID 50 MCG tablet, TAKE 1 TABLET DAILY, Disp: 90 tablet, Rfl: 4 .  cyclobenzaprine (FLEXERIL) 10 MG tablet, Take 1 tablet (10 mg total) by mouth 2 (two) times daily as needed for muscle spasms. Do not drive while taking as can cause drowsiness, Disp: 15 tablet, Rfl: 0  Allergies Ceclor [cefaclor]; Prednisone; and Sulfa antibiotics  Family History  Problem Relation Age of Onset  . Heart disease Maternal Grandfather     Social History Social History   Tobacco Use  . Smoking status: Never Smoker  . Smokeless tobacco: Never Used  Substance Use Topics  . Alcohol use: Yes  . Drug use: No    Review of Systems Constitutional: No fever Cardiovascular: Denies chest pain. Respiratory: Denies shortness of breath. Gastrointestinal: No abdominal pain.  No nausea, no vomiting.  No diarrhea.  Genitourinary: Negative for dysuria. Musculoskeletal: positive for back pain. Skin: Negative for rash.  ____________________________________________   PHYSICAL EXAM:  VITAL SIGNS: ED Triage Vitals  Enc Vitals Group     BP 03/31/18 1102 113/66     Pulse Rate 03/31/18 1102 82     Resp 03/31/18 1102 18     Temp 03/31/18 1102 98 F (36.7 C)     Temp Source 03/31/18 1102 Oral     SpO2 03/31/18 1102 99 %     Weight 03/31/18 1058  180 lb (81.6 kg)     Height 03/31/18 1058 5\' 5"  (1.651 m)     Head Circumference --      Peak Flow --      Pain Score 03/31/18 1058 7     Pain Loc --      Pain Edu? --      Excl. in GC? --     Constitutional: Alert and oriented. Well appearing and in no acute distress. ENT      Head: Normocephalic and atraumatic. Cardiovascular: Normal rate, regular rhythm. Grossly normal heart sounds.  Good peripheral circulation. Respiratory: Normal respiratory effort without tachypnea nor retractions. Breath sounds are clear and equal bilaterally. No wheezes, rales,  rhonchi. Gastrointestinal: Soft and nontender No CVA tenderness. Musculoskeletal:No midline cervical, thoracic or lumbar tenderness to palpation Except: No midline tenderness.  Minimal left lower paralumbar tenderness to palpation, mild to moderate right lower paralumbar tenderness palpation and tenderness to right lower latissimus dorsi, approximate 2 cm ecchymosis present, no rash, pain with lumbar flexion and extension but full range of motion present, steady gait, minimal pain with right and left rotation, mild pain with standing bilateral knee lifts, no saddle anesthesia, changes positions quickly. Neurologic:  Normal speech and language. No gross focal neurologic deficits are appreciated. Speech is normal. No gait instability.  Skin:  Skin is warm, dry and intact. No rash noted. Psychiatric: Mood and affect are normal. Speech and behavior are normal. Patient exhibits appropriate insight and judgment   ___________________________________________   LABS (all labs ordered are listed, but only abnormal results are displayed)  Labs Reviewed  URINALYSIS, COMPLETE (UACMP) WITH MICROSCOPIC - Abnormal; Notable for the following components:      Result Value   APPearance HAZY (*)    Leukocytes, UA TRACE (*)    Bacteria, UA FEW (*)    All other components within normal limits  URINE CULTURE     PROCEDURES Procedures    INITIAL IMPRESSION / ASSESSMENT AND PLAN / ED COURSE  Pertinent labs & imaging results that were available during my care of the patient were reviewed by me and considered in my medical decision making (see chart for details).  Well-appearing patient.  No acute distress.  Low back pain for the last few days.  Urinalysis reviewed, suspect contaminated sample, will await urine culture.  Patient denies any trauma.  No midline tenderness.  Suspect low back strain.  Over-the-counter ibuprofen, PRN Flexeril.  Discussed supportive care, stretching and monitoring.Discussed  indication, risks and benefits of medications with patient.  Discussed follow up with Primary care physician this week as needed. Discussed follow up and return parameters including no resolution or any worsening concerns. Patient verbalized understanding and agreed to plan.   ____________________________________________   FINAL CLINICAL IMPRESSION(S) / ED DIAGNOSES  Final diagnoses:  Acute bilateral low back pain without sciatica     ED Discharge Orders         Ordered    cyclobenzaprine (FLEXERIL) 10 MG tablet  2 times daily PRN     03/31/18 1143           Note: This dictation was prepared with Dragon dictation along with smaller phrase technology. Any transcriptional errors that result from this process are unintentional.         Renford DillsMiller, Spenser Harren, NP 03/31/18 1202

## 2018-04-02 LAB — URINE CULTURE

## 2018-04-09 ENCOUNTER — Emergency Department

## 2018-04-09 ENCOUNTER — Emergency Department
Admission: EM | Admit: 2018-04-09 | Discharge: 2018-04-09 | Disposition: A | Discharge status: Home / Self Care | Attending: Emergency Medicine | Admitting: Emergency Medicine

## 2018-04-09 ENCOUNTER — Other Ambulatory Visit: Payer: Self-pay

## 2018-04-09 ENCOUNTER — Encounter: Payer: Self-pay | Admitting: Emergency Medicine

## 2018-04-09 DIAGNOSIS — M545 Low back pain, unspecified: Secondary | ICD-10-CM

## 2018-04-09 DIAGNOSIS — F329 Major depressive disorder, single episode, unspecified: Secondary | ICD-10-CM | POA: Diagnosis not present

## 2018-04-09 DIAGNOSIS — F419 Anxiety disorder, unspecified: Secondary | ICD-10-CM | POA: Insufficient documentation

## 2018-04-09 DIAGNOSIS — M549 Dorsalgia, unspecified: Secondary | ICD-10-CM | POA: Diagnosis present

## 2018-04-09 DIAGNOSIS — Z79899 Other long term (current) drug therapy: Secondary | ICD-10-CM | POA: Diagnosis not present

## 2018-04-09 MED ORDER — METHYLPREDNISOLONE 4 MG PO TBPK: ORAL_TABLET | ORAL | 0 refills | Status: DC

## 2018-04-09 MED ORDER — DEXAMETHASONE SODIUM PHOSPHATE 10 MG/ML IJ SOLN
10.0000 mg | Freq: Once | INTRAMUSCULAR | Status: AC
  Administered 2018-04-09: 10 mg via INTRAMUSCULAR

## 2018-04-09 MED ORDER — DEXAMETHASONE SODIUM PHOSPHATE 10 MG/ML IJ SOLN
INTRAMUSCULAR | Status: AC
  Filled 2018-04-09: qty 1

## 2018-04-09 MED ORDER — METHOCARBAMOL 750 MG PO TABS: 750.0000 mg | ORAL_TABLET | Freq: Four times a day (QID) | ORAL | 0 refills | Status: DC

## 2018-04-09 MED ORDER — LIDOCAINE 5 % EX PTCH: 1.0000 | MEDICATED_PATCH | Freq: Two times a day (BID) | CUTANEOUS | 0 refills | Status: AC

## 2018-04-09 MED ORDER — LIDOCAINE 5 % EX PTCH
1.0000 | MEDICATED_PATCH | CUTANEOUS | Status: DC
  Administered 2018-04-09: 1 via TRANSDERMAL
  Filled 2018-04-09: qty 1

## 2018-04-09 NOTE — ED Notes (Signed)
preg test negative °

## 2018-04-09 NOTE — ED Triage Notes (Signed)
C/O low back pain.  States had back pain while pregnant and had been treated with muscle relaxer.  Currently out of medication and toddler is 17 lbs

## 2018-04-09 NOTE — ED Provider Notes (Signed)
[facility] Emergency Department Provider Note   ____________________________________________   First MD Initiated Contact with Patient 04/09/18 1024     (approximate)  I have reviewed the triage vital signs and the nursing notes.   HISTORY  Chief Complaint Back Pain    HPI Daisy Hancock is a 32 y.o. female patient complain of 5 months of back pain.  Patient stated pain started the last 2 months of her pregnancy.  Patient states she is 3 months postpartum and pain is increasing.  Patient was seen by urgent care clinic 2 days ago and diagnosed with suspected low back strain.  Patient was prescribed ibuprofen and Flexeril.  Patient states no relief with these 2 medications.  Patient rates the pain a 7/10.  Patient described the pain is "achy".  Pain increased with movement but feels better when she standing up.  Patient denies radicular component to her back pain.  Patient denies bladder bowel dysfunction.  Patient urine culture revealed multiple bacterial morphotypes consistent with 30 collection.  Past Medical History:  Diagnosis Date  . Anxiety   . Depression   . Thyroid disease   . Vitamin D deficiency     Patient Active Problem List   Diagnosis Date Noted  . Obesity (BMI 30-39.9) 07/30/2015    Past Surgical History:  Procedure Laterality Date  . NO PAST SURGERIES      Prior to Admission medications   Medication Sig Start Date End Date Taking? Authorizing Provider  Cholecalciferol (VITAMIN D) 2000 units tablet Take 2,000 Units by mouth daily.    [provider]  COLLAGEN PO Take by mouth.    [provider]  cyclobenzaprine (FLEXERIL) 10 MG tablet Take 1 tablet (10 mg total) by mouth 2 (two) times daily as needed for muscle spasms. Do not drive while taking as can cause drowsiness 03/31/18   Renford Dills, NP  lidocaine (LIDODERM) 5 % Place 1 patch onto the skin every 12 (twelve) hours. Remove & Discard patch within 12  hours or as directed by MD 04/09/18 04/09/19  Joni Reining, PA-C  loratadine (CLARITIN) 10 MG tablet Take 10 mg by mouth daily.    [provider]  methocarbamol (ROBAXIN-750) 750 MG tablet Take 1 tablet (750 mg total) by mouth 4 (four) times daily. 04/09/18   Joni Reining, PA-C  methylPREDNISolone (MEDROL DOSEPAK) 4 MG TBPK tablet Take Tapered dose as directed 04/09/18   Joni Reining, PA-C  SPRINTEC 28 0.25-35 MG-MCG tablet Take 1 tablet by mouth daily. 03/17/18   Purcell Nails, CNM  SYNTHROID 50 MCG tablet TAKE 1 TABLET DAILY 04/26/17   Shambley, Melody N, CNM    Allergies Ceclor [cefaclor]; Prednisone; and Sulfa antibiotics  Family History  Problem Relation Age of Onset  . Heart disease Maternal Grandfather     Social History Social History   Tobacco Use  . Smoking status: Never Smoker  . Smokeless tobacco: Never Used  Substance Use Topics  . Alcohol use: Yes  . Drug use: No    Review of Systems Constitutional: No fever/chills Eyes: No visual changes. ENT: No sore throat. Cardiovascular: Denies chest pain. Respiratory: Denies shortness of breath. Gastrointestinal: No abdominal pain.  No nausea, no vomiting.  No diarrhea.  No constipation. Genitourinary: Negative for dysuria. Musculoskeletal: Positive for back pain. Skin: Negative for rash. Neurological: Negative for headaches, focal weakness or numbness. Psychiatric:Anxiety and depression. Endocrine:Hypothyroidism. Allergic/Immunilogical: See medication list. ____________________________________________   PHYSICAL EXAM:  VITAL SIGNS:  ED Triage Vitals  Enc Vitals Group     BP 04/09/18 1020 112/68     Pulse Rate 04/09/18 1020 94     Resp 04/09/18 1020 16     Temp 04/09/18 1020 98.3 F (36.8 C)     Temp Source 04/09/18 1020 Oral     SpO2 04/09/18 1020 99 %     Weight 04/09/18 1017 180 lb (81.6 kg)     Height --      Head Circumference --      Peak Flow --      Pain Score 04/09/18 1017  7     Pain Loc --      Pain Edu? --      Excl. in GC? --    Constitutional: Alert and oriented. Well appearing and in no acute distress. Cardiovascular: Normal rate, regular rhythm. Grossly normal heart sounds.  Good peripheral circulation. Respiratory: Normal respiratory effort.  No retractions. Lungs CTAB. Gastrointestinal: Soft and nontender. No distention. No abdominal bruits. No CVA tenderness. Musculoskeletal: No obvious deformity to the lumbar spine.  Decreased range of motion with flexion and lateral movements.  Patient has moderate guarding palpation right lateral aspect of L3 and L4.  No lower extremity tenderness nor edema.  No joint effusions. Neurologic:  Normal speech and language. No gross focal neurologic deficits are appreciated. No gait instability. Skin:  Skin is warm, dry and intact. No rash noted. Psychiatric: Mood and affect are normal. Speech and behavior are normal.  ____________________________________________   LABS (all labs ordered are listed, but only abnormal results are displayed)  Labs Reviewed - No data to display ____________________________________________  EKG   ____________________________________________  RADIOLOGY  ED MD interpretation:    Official radiology report(s): Dg Lumbar Spine 2-3 Views  Result Date: 04/09/2018 CLINICAL DATA:  low back pain beginning during pregnancy, patient is 3 month post partum. Patient states pain is worse on right side and worse when sitting down, pain is better upon standing upright. Patient states no known recent injury or injuries in past. EXAM: LUMBAR SPINE - 2-3 VIEW COMPARISON:  None. FINDINGS: There is no evidence of lumbar spine fracture. Alignment is normal. Intervertebral disc spaces are maintained. IMPRESSION: Negative. Electronically Signed   By: Corlis Leak  Hassell M.D.   On: 04/09/2018 11:32    ____________________________________________   PROCEDURES  Procedure(s) performed:  None  Procedures  Critical Care performed: No  ____________________________________________   INITIAL IMPRESSION / ASSESSMENT AND PLAN / ED COURSE  As part of my medical decision making, I reviewed the following data within the electronic MEDICAL RECORD NUMBER    Acute low back pain.  Discussed negative x-ray findings with patient.  Patient given discharge care instructions and advised take medication directed.  Patient advised follow-up PCP in 3 days if no improvement.  Return to ED if condition worsens.      ____________________________________________   FINAL CLINICAL IMPRESSION(S) / ED DIAGNOSES  Final diagnoses:  Acute midline low back pain without sciatica     ED Discharge Orders         Ordered    lidocaine (LIDODERM) 5 %  Every 12 hours     04/09/18 1147    methocarbamol (ROBAXIN-750) 750 MG tablet  4 times daily     04/09/18 1147    methylPREDNISolone (MEDROL DOSEPAK) 4 MG TBPK tablet     04/09/18 1147           Note:  This document was prepared using Dragon voice recognition  software and may include unintentional dictation errors.    Joni ReiningSmith, Ronald K, PA-C 04/09/18 1150    Jeanmarie PlantMcShane, James A, MD 04/09/18 1430

## 2018-04-29 ENCOUNTER — Other Ambulatory Visit (HOSPITAL_COMMUNITY)
Admission: RE | Admit: 2018-04-29 | Discharge: 2018-04-29 | Disposition: A | Discharge status: Home / Self Care | Source: Ambulatory Visit | Attending: Obstetrics and Gynecology | Admitting: Obstetrics and Gynecology

## 2018-04-29 ENCOUNTER — Encounter: Payer: Self-pay | Admitting: Obstetrics and Gynecology

## 2018-04-29 ENCOUNTER — Ambulatory Visit (INDEPENDENT_AMBULATORY_CARE_PROVIDER_SITE_OTHER): Admitting: Obstetrics and Gynecology

## 2018-04-29 VITALS — BP 126/79 | HR 102 | Ht 65.0 in | Wt 193.7 lb

## 2018-04-29 DIAGNOSIS — Z6832 Body mass index (BMI) 32.0-32.9, adult: Secondary | ICD-10-CM

## 2018-04-29 DIAGNOSIS — E669 Obesity, unspecified: Secondary | ICD-10-CM | POA: Diagnosis not present

## 2018-04-29 DIAGNOSIS — Z01419 Encounter for gynecological examination (general) (routine) without abnormal findings: Secondary | ICD-10-CM | POA: Insufficient documentation

## 2018-04-29 DIAGNOSIS — E039 Hypothyroidism, unspecified: Secondary | ICD-10-CM

## 2018-04-29 MED ORDER — PHENTERMINE HCL 37.5 MG PO TABS: 37.5000 mg | ORAL_TABLET | Freq: Every day | ORAL | 2 refills | Status: DC

## 2018-04-29 MED ORDER — CYANOCOBALAMIN 1000 MCG/ML IJ SOLN: 1000.0000 ug | INTRAMUSCULAR | 1 refills | Status: DC

## 2018-04-29 NOTE — Patient Instructions (Signed)
 Preventive Care 18-39 Years, Female Preventive care refers to lifestyle choices and visits with your health care provider that can promote health and wellness. What does preventive care include?   A yearly physical exam. This is also called an annual well check.  Dental exams once or twice a year.  Routine eye exams. Ask your health care provider how often you should have your eyes checked.  Personal lifestyle choices, including: ? Daily care of your teeth and gums. ? Regular physical activity. ? Eating a healthy diet. ? Avoiding tobacco and drug use. ? Limiting alcohol use. ? Practicing safe sex. ? Taking vitamin and mineral supplements as recommended by your health care provider. What happens during an annual well check? The services and screenings done by your health care provider during your annual well check will depend on your age, overall health, lifestyle risk factors, and family history of disease. Counseling Your health care provider may ask you questions about your:  Alcohol use.  Tobacco use.  Drug use.  Emotional well-being.  Home and relationship well-being.  Sexual activity.  Eating habits.  Work and work environment.  Method of birth control.  Menstrual cycle.  Pregnancy history. Screening You may have the following tests or measurements:  Height, weight, and BMI.  Diabetes screening. This is done by checking your blood sugar (glucose) after you have not eaten for a while (fasting).  Blood pressure.  Lipid and cholesterol levels. These may be checked every 5 years starting at age 20.  Skin check.  Hepatitis C blood test.  Hepatitis B blood test.  Sexually transmitted disease (STD) testing.  BRCA-related cancer screening. This may be done if you have a family history of breast, ovarian, tubal, or peritoneal cancers.  Pelvic exam and Pap test. This may be done every 3 years starting at age 21. Starting at age 30, this may be done  every 5 years if you have a Pap test in combination with an HPV test. Discuss your test results, treatment options, and if necessary, the need for more tests with your health care provider. Vaccines Your health care provider may recommend certain vaccines, such as:  Influenza vaccine. This is recommended every year.  Tetanus, diphtheria, and acellular pertussis (Tdap, Td) vaccine. You may need a Td booster every 10 years.  Varicella vaccine. You may need this if you have not been vaccinated.  HPV vaccine. If you are 26 or younger, you may need three doses over 6 months.  Measles, mumps, and rubella (MMR) vaccine. You may need at least one dose of MMR. You may also need a second dose.  Pneumococcal 13-valent conjugate (PCV13) vaccine. You may need this if you have certain conditions and were not previously vaccinated.  Pneumococcal polysaccharide (PPSV23) vaccine. You may need one or two doses if you smoke cigarettes or if you have certain conditions.  Meningococcal vaccine. One dose is recommended if you are age 19-21 years and a first-year college student living in a residence hall, or if you have one of several medical conditions. You may also need additional booster doses.  Hepatitis A vaccine. You may need this if you have certain conditions or if you travel or work in places where you may be exposed to hepatitis A.  Hepatitis B vaccine. You may need this if you have certain conditions or if you travel or work in places where you may be exposed to hepatitis B.  Haemophilus influenzae type b (Hib) vaccine. You may need this if   you have certain risk factors. Talk to your health care provider about which screenings and vaccines you need and how often you need them. This information is not intended to replace advice given to you by your health care provider. Make sure you discuss any questions you have with your health care provider. Document Released: 05/26/2001 Document Revised:  11/10/2016 Document Reviewed: 01/29/2015 Elsevier Interactive Patient Education  2019 Elsevier Inc.  

## 2018-04-29 NOTE — Progress Notes (Signed)
Subjective:   Daisy Hancock is a 33 y.o. [phone] Caucasian female here for a routine well-woman exam.  Patient's last menstrual period was 04/28/2018.    Current complaints: desires restarting adipex and weight loss program.  PCP: me       does desire labs  Social History: Sexual: heterosexual Marital Status: married Living situation: with family Occupation: homemaker Tobacco/alcohol: no tobacco use Illicit drugs: no history of illicit drug use  The following portions of the patient's history were reviewed and updated as appropriate: allergies, current medications, past family history, past medical history, past social history, past surgical history and problem list.  Past Medical History Past Medical History:  Diagnosis Date  . Anxiety   . Depression   . Thyroid disease   . Vitamin D deficiency     Past Surgical History Past Surgical History:  Procedure Laterality Date  . NO PAST SURGERIES      Gynecologic History [id number]  Patient's last menstrual period was 04/28/2018. Contraception: OCP (estrogen/progesterone) Last Pap: 12/2016. Results were: normal   Obstetric History OB History  Gravida Para Term Preterm AB Living  4 2 2   2 2   SAB TAB Ectopic Multiple Live Births  2     0 2    # Outcome Date GA Lbr Len/2nd Weight Sex Delivery Anes PTL Lv  4 Term 12/16/17 [id number] / 00:18 9 lb 6.3 oz (4.26 kg) M Vag-Spont Local  LIV  3 Term 03/13/14    F Vag-Spont   LIV  2 SAB           1 SAB             Current Medications Current Outpatient Medications on File Prior to Visit  Medication Sig Dispense Refill  . Cholecalciferol (VITAMIN D) 2000 units tablet Take 2,000 Units by mouth daily.    . COLLAGEN PO Take by mouth.    . loratadine (CLARITIN) 10 MG tablet Take 10 mg by mouth daily.    . methocarbamol (ROBAXIN-750) 750 MG tablet Take 1 tablet (750 mg total) by mouth 4 (four) times daily. 20 tablet 0  . SPRINTEC 28 0.25-35 MG-MCG tablet Take 1 tablet by mouth  daily. 3 Package 11  . SYNTHROID 50 MCG tablet TAKE 1 TABLET DAILY 90 tablet 4  . cyclobenzaprine (FLEXERIL) 10 MG tablet Take 1 tablet (10 mg total) by mouth 2 (two) times daily as needed for muscle spasms. Do not drive while taking as can cause drowsiness (Patient not taking: Reported on 04/29/2018) 15 tablet 0  . lidocaine (LIDODERM) 5 % Place 1 patch onto the skin every 12 (twelve) hours. Remove & Discard patch within 12 hours or as directed by MD (Patient not taking: Reported on 04/29/2018) 10 patch 0  . methylPREDNISolone (MEDROL DOSEPAK) 4 MG TBPK tablet Take Tapered dose as directed 21 tablet 0   No current facility-administered medications on file prior to visit.     Review of Systems Patient denies any headaches, blurred vision, shortness of breath, chest pain, abdominal pain, problems with bowel movements, urination, or intercourse.  Objective:  BP 126/79   Pulse (!) 102   Ht 5\' 5"  (1.651 m)   Wt 193 lb 11.2 oz (87.9 kg)   LMP 04/28/2018   BMI 32.23 kg/m  Physical Exam  General:  Well developed, well nourished, no acute distress. She is alert and oriented x3. Skin:  Warm and dry Neck:  Midline trachea, no thyromegaly or nodules Cardiovascular: Regular rate and rhythm,  no murmur heard Lungs:  Effort normal, all lung fields clear to auscultation bilaterally Breasts:  No dominant palpable mass, retraction, or nipple discharge Abdomen:  Soft, non tender, no hepatosplenomegaly or masses Pelvic:  External genitalia is normal in appearance.  The vagina is normal in appearance. The cervix is bulbous, no CMT.  Thin prep pap is done with HR HPV cotesting. Uterus is felt to be normal size, shape, and contour.  No adnexal masses or tenderness noted. Extremities:  No swelling or varicosities noted Psych:  She has a normal mood and affect  Assessment:   Healthy well-woman exam Obesity/BMI 32 hypothyroidism  Plan:  Labs obtained-will follow up accordingly  B12 injection given today  and adipex prescribed.-RTC in 4 weeks for weight check. F/U 1 year for AE, or sooner if needed   Jamiee Milholland Suzan NailerN Talecia Sherlin, CNM

## 2018-04-30 LAB — COMPREHENSIVE METABOLIC PANEL
ALBUMIN: 4.2 g/dL (ref 3.5–5.5)
ALT: 21 IU/L (ref 0–32)
AST: 19 IU/L (ref 0–40)
Albumin/Globulin Ratio: 1.6 (ref 1.2–2.2)
Alkaline Phosphatase: 49 IU/L (ref 39–117)
BUN/Creatinine Ratio: 15 (ref 9–23)
BUN: 11 mg/dL (ref 6–20)
Bilirubin Total: 0.3 mg/dL (ref 0.0–1.2)
CHLORIDE: 103 mmol/L (ref 96–106)
CO2: 24 mmol/L (ref 20–29)
CREATININE: 0.75 mg/dL (ref 0.57–1.00)
Calcium: 9.6 mg/dL (ref 8.7–10.2)
GFR calc non Af Amer: 105 mL/min/{1.73_m2} (ref >59)
GFR, EST AFRICAN AMERICAN: 121 mL/min/{1.73_m2} (ref >59)
GLUCOSE: 87 mg/dL (ref 65–99)
Globulin, Total: 2.6 g/dL (ref 1.5–4.5)
POTASSIUM: 3.9 mmol/L (ref 3.5–5.2)
SODIUM: 142 mmol/L (ref 134–144)
TOTAL PROTEIN: 6.8 g/dL (ref 6.0–8.5)

## 2018-04-30 LAB — LIPID PANEL
CHOLESTEROL TOTAL: 159 mg/dL (ref 100–199)
Chol/HDL Ratio: 3.9 ratio (ref 0.0–4.4)
HDL: 41 mg/dL (ref >39)
LDL Calculated: 75 mg/dL (ref 0–99)
Triglycerides: 215 mg/dL — ABNORMAL HIGH (ref 0–149)
VLDL Cholesterol Cal: 43 mg/dL — ABNORMAL HIGH (ref 5–40)

## 2018-04-30 LAB — THYROID PANEL WITH TSH
FREE THYROXINE INDEX: 2.4 (ref 1.2–4.9)
T3 UPTAKE RATIO: 25 % (ref 24–39)
T4, Total: 9.7 ug/dL (ref 4.5–12.0)
TSH: 1.56 u[IU]/mL (ref 0.450–4.500)

## 2018-05-04 LAB — CYTOLOGY - PAP
Diagnosis: NEGATIVE
HPV (WINDOPATH): NOT DETECTED

## 2018-05-26 ENCOUNTER — Ambulatory Visit (INDEPENDENT_AMBULATORY_CARE_PROVIDER_SITE_OTHER): Admitting: Obstetrics and Gynecology

## 2018-05-26 VITALS — BP 110/67 | HR 98 | Ht 65.0 in | Wt 187.2 lb

## 2018-05-26 DIAGNOSIS — Z713 Dietary counseling and surveillance: Secondary | ICD-10-CM

## 2018-05-26 DIAGNOSIS — Z6832 Body mass index (BMI) 32.0-32.9, adult: Secondary | ICD-10-CM

## 2018-05-26 MED ORDER — CYANOCOBALAMIN 1000 MCG/ML IJ SOLN
1000.0000 ug | Freq: Once | INTRAMUSCULAR | Status: AC
  Administered 2018-05-26: 1000 ug via INTRAMUSCULAR

## 2018-05-26 NOTE — Progress Notes (Signed)
Pt is present today for b12 injection, wt and bp check. Pt stated that she was doing well and taking her medication as prescribed. Pt last weight and bp was 04/29/2018 193lb and 126/79 P 102.    BP 110/67   Pulse 98   Ht 5\' 5"  (1.651 m)   Wt 187 lb 3.2 oz (84.9 kg)   LMP 04/28/2018   BMI 31.15 kg/m

## 2018-06-22 ENCOUNTER — Other Ambulatory Visit: Payer: Self-pay

## 2018-06-22 ENCOUNTER — Ambulatory Visit (INDEPENDENT_AMBULATORY_CARE_PROVIDER_SITE_OTHER): Admitting: Obstetrics and Gynecology

## 2018-06-22 ENCOUNTER — Encounter: Payer: Self-pay | Admitting: Obstetrics and Gynecology

## 2018-06-22 VITALS — BP 119/83 | HR 96 | Ht 65.0 in | Wt 185.0 lb

## 2018-06-22 DIAGNOSIS — E669 Obesity, unspecified: Secondary | ICD-10-CM

## 2018-06-22 MED ORDER — CYANOCOBALAMIN 1000 MCG/ML IJ SOLN
1000.0000 ug | Freq: Once | INTRAMUSCULAR | Status: AC
  Administered 2018-06-22: 1000 ug via INTRAMUSCULAR

## 2018-06-22 NOTE — Progress Notes (Signed)
Pt is here for wt, bp check, b-12 inj She is doing well, denies any s/e  06/12/18 wt- 185lb 05/26/18 wt -187lb  Waist 34.5 inches

## 2018-07-19 ENCOUNTER — Encounter: Admitting: Obstetrics and Gynecology

## 2019-01-04 ENCOUNTER — Other Ambulatory Visit: Payer: Self-pay

## 2019-01-04 ENCOUNTER — Encounter: Payer: Self-pay | Admitting: Obstetrics and Gynecology

## 2019-01-04 ENCOUNTER — Other Ambulatory Visit: Payer: Self-pay | Admitting: *Deleted

## 2019-01-04 ENCOUNTER — Ambulatory Visit (INDEPENDENT_AMBULATORY_CARE_PROVIDER_SITE_OTHER): Admitting: Obstetrics and Gynecology

## 2019-01-04 VITALS — BP 121/85 | HR 99 | Ht 65.0 in | Wt 185.3 lb

## 2019-01-04 DIAGNOSIS — E669 Obesity, unspecified: Secondary | ICD-10-CM | POA: Diagnosis not present

## 2019-01-04 DIAGNOSIS — Z6830 Body mass index (BMI) 30.0-30.9, adult: Secondary | ICD-10-CM | POA: Diagnosis not present

## 2019-01-04 DIAGNOSIS — Z7689 Persons encountering health services in other specified circumstances: Secondary | ICD-10-CM

## 2019-01-04 MED ORDER — PHENTERMINE HCL 37.5 MG PO TABS: 37.5000 mg | ORAL_TABLET | Freq: Every day | ORAL | 2 refills | Status: DC

## 2019-01-04 MED ORDER — SYNTHROID 50 MCG PO TABS: 50.0000 ug | ORAL_TABLET | Freq: Every day | ORAL | 4 refills | Status: DC

## 2019-01-04 MED ORDER — LEVOTHYROXINE SODIUM 50 MCG PO TABS: 50.0000 ug | ORAL_TABLET | Freq: Every day | ORAL | 2 refills | Status: DC

## 2019-01-04 MED ORDER — CYANOCOBALAMIN 1000 MCG/ML IJ SOLN: 1000.0000 ug | INTRAMUSCULAR | 1 refills | Status: DC

## 2019-01-04 NOTE — Progress Notes (Signed)
Subjective:  Daisy Hancock is a 33 y.o. [phone] at Unknown being seen today for weight loss management- initial visit.  Patient reports General ROS: negative and reports previous weight loss attempts have been successful with adipex. Is exercising 5-6 days a week. Is still drinking water in large amounts.     Onset followed:   recent pregnancy   Past treatment has included: small frequent feedings, nutritional supplement, vitamin supplement,  vitamin B-12 injections, appetite suppressant  The following portions of the patient's history were reviewed and updated as appropriate: allergies, current medications, past family history, past medical history, past social history, past surgical history and problem list.   Objective:   Vitals:   01/04/19 1052  BP: 121/85  Pulse: 99  Weight: 185 lb 4.8 oz (84.1 kg)  Height: 5\' 5"  (1.651 m)    General:  Alert, oriented and cooperative. Patient is in no acute distress.  :   :   :   :   :   :   PE: Well groomed female in no current distress,   Mental Status: Normal mood and affect. Normal behavior. Normal judgment and thought content.   Current BMI: Body mass index is 30.84 kg/m.   Assessment and Plan:  Obesity  There are no diagnoses linked to this encounter.  Plan: low carb, High protein diet RX for adipex 37.5 mg daily and B12 1011mcg.ml monthly, to start now with first injection given at today's visit. Reviewed side-effects common to both medications and expected outcomes. Increase daily water intake to at least 8 bottle a day, every day.  Goal is to reduse weight by 10% by end of three months, and will re-evaluate then.  RTC in 4 weeks for Nurse visit to check weight & BP, and get next B12 injections.    Please refer to After Visit Summary for other counseling recommendations.    [place],  N, CNM    [place], CNM      Consider the Low Glycemic Index Diet and 6 smaller meals daily .  This  boosts your metabolism and regulates your sugars:   Use the protein bar by Atkins because they have lots of fiber in them  Find the low carb flatbreads, tortillas and pita breads for sandwiches:  Joseph's makes a pita bread and a flat bread , available at [facility] and [employer]; [place] makes a low carb flatbread available at [employer] and HT that is 9 net carbs and 100 cal [place] makes a low carb whole wheat tortilla available at [employer] most grocery stores with 6 net carbs and 210 cal  [place] yogurt can still have a lot of carbs .  Dannon Light N fit has 80 cal and 8 carbs

## 2019-01-04 NOTE — Patient Instructions (Signed)
Use the protein bar by Atkins because they have lots of fiber in them  Find the low carb flatbreads, tortillas and pita breads for sandwiches:  Joseph's makes a pita bread and a flat bread , available at [employer] and [employer]; Toufayah makes a low carb flatbread available at [employer] and HT that is 9 net carbs and 100 cal [place] makes a low carb whole wheat tortilla available at [employer]Js,and most grocery stores with 6 net carbs and 210 cal  Greek yogurt can still have a lot of carbs .  Dannon Light N fit has 80 cal and 8 carbs  

## 2019-02-02 ENCOUNTER — Ambulatory Visit (INDEPENDENT_AMBULATORY_CARE_PROVIDER_SITE_OTHER): Admitting: Obstetrics and Gynecology

## 2019-02-02 ENCOUNTER — Other Ambulatory Visit: Payer: Self-pay

## 2019-02-02 ENCOUNTER — Encounter: Payer: Self-pay | Admitting: Obstetrics and Gynecology

## 2019-02-02 VITALS — BP 106/72 | HR 91 | Ht 65.0 in | Wt 180.2 lb

## 2019-02-02 DIAGNOSIS — Z713 Dietary counseling and surveillance: Secondary | ICD-10-CM | POA: Diagnosis not present

## 2019-02-02 DIAGNOSIS — Z7689 Persons encountering health services in other specified circumstances: Secondary | ICD-10-CM

## 2019-02-02 MED ORDER — CYANOCOBALAMIN 1000 MCG/ML IJ SOLN
1000.0000 ug | Freq: Once | INTRAMUSCULAR | Status: AC
  Administered 2019-02-02: 12:00:00 1000 ug via INTRAMUSCULAR

## 2019-02-02 MED ORDER — CYANOCOBALAMIN 1000 MCG/ML IJ SOLN: 1000.0000 ug | Freq: Once | INTRAMUSCULAR | Status: DC

## 2019-02-02 NOTE — Progress Notes (Signed)
Pt presents for  Weight,B/P, B-12 injection. No side effects of medications-Phentermine or B-12. Weight loss 5 lbs. Encourage eating healthy and exercise. Vit b 12 given.

## 2019-03-01 ENCOUNTER — Encounter: Admitting: Obstetrics and Gynecology

## 2019-03-06 ENCOUNTER — Other Ambulatory Visit: Payer: Self-pay

## 2019-03-06 ENCOUNTER — Encounter: Payer: Self-pay | Admitting: Certified Nurse Midwife

## 2019-03-06 ENCOUNTER — Ambulatory Visit (INDEPENDENT_AMBULATORY_CARE_PROVIDER_SITE_OTHER): Admitting: Certified Nurse Midwife

## 2019-03-06 VITALS — BP 101/67 | HR 96 | Ht 65.0 in | Wt 180.1 lb

## 2019-03-06 DIAGNOSIS — E669 Obesity, unspecified: Secondary | ICD-10-CM

## 2019-03-06 DIAGNOSIS — Z6829 Body mass index (BMI) 29.0-29.9, adult: Secondary | ICD-10-CM

## 2019-03-06 DIAGNOSIS — Z7689 Persons encountering health services in other specified circumstances: Secondary | ICD-10-CM

## 2019-03-06 MED ORDER — CYANOCOBALAMIN 1000 MCG/ML IJ SOLN
1000.0000 ug | Freq: Once | INTRAMUSCULAR | Status: AC
  Administered 2019-03-06: 1000 ug via INTRAMUSCULAR

## 2019-03-06 MED ORDER — PHENTERMINE HCL 37.5 MG PO TABS: 37.5000 mg | ORAL_TABLET | Freq: Every day | ORAL | 2 refills | Status: DC

## 2019-03-06 NOTE — Patient Instructions (Signed)

## 2019-03-06 NOTE — Progress Notes (Signed)
SUBJECTIVE:  33 y.o. here for follow-up weight loss visit, previously seen 4 weeks ago. Denies any concerns and feels like medication is working well. She went on vacation this past month and contributes this to not losing any weight. Reviewed phentramine medication use and recommended 5% weight loss. Discussed short term use of "afew weeks" up to 1 yr if responding well to plan. She verbalizes and agrees to plan.   She started medication in Sept 2020 @ 185 lbs October she lost 5 lbs  November she maintained weight at 180 lbs.   Doing beach body-4 times weekly Eating , glutin, dairy, processed sugar free diet.   OBJECTIVE:  BP 101/67   Pulse 96   Ht 5\' 5"  (1.651 m)   Wt 180 lb 2 oz (81.7 kg)   BMI 29.97 kg/m   Body mass index is 29.97 kg/m. Patient appears well. ASSESSMENT:  Obesity- responding well to weight loss plan PLAN:  To continue with current medications. B12 1050mcg/ml injection given RTC in 4 weeks as planned  Philip Aspen,, CNM

## 2019-04-03 ENCOUNTER — Encounter: Admitting: Certified Nurse Midwife

## 2019-04-04 ENCOUNTER — Other Ambulatory Visit: Payer: Self-pay | Admitting: Certified Nurse Midwife

## 2019-04-21 ENCOUNTER — Other Ambulatory Visit: Payer: Self-pay

## 2019-04-21 ENCOUNTER — Encounter: Payer: Self-pay | Admitting: Certified Nurse Midwife

## 2019-04-21 ENCOUNTER — Ambulatory Visit (INDEPENDENT_AMBULATORY_CARE_PROVIDER_SITE_OTHER): Admitting: Certified Nurse Midwife

## 2019-04-21 VITALS — BP 107/69 | HR 92 | Wt 175.2 lb

## 2019-04-21 DIAGNOSIS — E669 Obesity, unspecified: Secondary | ICD-10-CM

## 2019-04-21 DIAGNOSIS — Z6829 Body mass index (BMI) 29.0-29.9, adult: Secondary | ICD-10-CM | POA: Diagnosis not present

## 2019-04-21 DIAGNOSIS — Z7689 Persons encountering health services in other specified circumstances: Secondary | ICD-10-CM

## 2019-04-21 MED ORDER — CYANOCOBALAMIN 1000 MCG/ML IJ SOLN
1000.0000 ug | Freq: Once | INTRAMUSCULAR | Status: AC
  Administered 2019-04-21: 1000 ug via INTRAMUSCULAR

## 2019-04-21 MED ORDER — PHENTERMINE HCL 37.5 MG PO TABS: 37.5000 mg | ORAL_TABLET | Freq: Every day | ORAL | 0 refills | Status: DC

## 2019-04-21 NOTE — Progress Notes (Signed)
SUBJECTIVE:  34 y.o. here for follow-up weight loss visit, previously seen 4 weeks ago. Denies any concerns and feels like medication is working well. She lost 5 lbs since her last visit. She has increased her work out to 1 hr.   OBJECTIVE:  BP 107/69   Pulse 92   Wt 175 lb 4 oz (79.5 kg)   BMI 29.16 kg/m   Body mass index is 29.16 kg/m.  Waist 36 1/2  Patient appears well. ASSESSMENT:  Obesity- responding well to weight loss plan PLAN:  To continue with current medications. B12 106mcg/ml injection given RTC in 4 weeks as planned  Doreene Burke, CNM

## 2019-04-21 NOTE — Patient Instructions (Signed)

## 2019-04-21 NOTE — Progress Notes (Signed)
Waist circumference 36 1/2 "

## 2019-05-05 ENCOUNTER — Encounter: Admitting: Obstetrics and Gynecology

## 2019-05-07 ENCOUNTER — Other Ambulatory Visit: Payer: Self-pay | Admitting: Certified Nurse Midwife

## 2019-05-08 ENCOUNTER — Telehealth: Payer: Self-pay

## 2019-05-08 ENCOUNTER — Other Ambulatory Visit: Payer: Self-pay

## 2019-05-08 MED ORDER — LEVOTHYROXINE SODIUM 50 MCG PO TABS: ORAL_TABLET | ORAL | 3 refills | Status: AC

## 2019-05-08 MED ORDER — SPRINTEC 28 0.25-35 MG-MCG PO TABS: 1.0000 | ORAL_TABLET | Freq: Every day | ORAL | 11 refills | Status: DC

## 2019-05-08 NOTE — Telephone Encounter (Signed)
Refills sent to preferred pharmacy.  

## 2019-05-08 NOTE — Telephone Encounter (Signed)
mychart message sent to patient regarding insurance.

## 2019-05-10 ENCOUNTER — Encounter: Payer: Self-pay | Admitting: Certified Nurse Midwife

## 2019-05-10 ENCOUNTER — Other Ambulatory Visit: Payer: Self-pay

## 2019-05-10 ENCOUNTER — Encounter: Admitting: Certified Nurse Midwife

## 2019-05-10 ENCOUNTER — Ambulatory Visit (INDEPENDENT_AMBULATORY_CARE_PROVIDER_SITE_OTHER): Admitting: Certified Nurse Midwife

## 2019-05-10 VITALS — BP 118/72 | HR 80 | Ht 65.0 in

## 2019-05-10 DIAGNOSIS — Z01419 Encounter for gynecological examination (general) (routine) without abnormal findings: Secondary | ICD-10-CM | POA: Diagnosis not present

## 2019-05-10 DIAGNOSIS — E039 Hypothyroidism, unspecified: Secondary | ICD-10-CM

## 2019-05-10 MED ORDER — SPRINTEC 28 0.25-35 MG-MCG PO TABS: 1.0000 | ORAL_TABLET | Freq: Every day | ORAL | 11 refills | Status: DC

## 2019-05-10 NOTE — Patient Instructions (Signed)
Preventive Care 21-35 Years Old, Female Preventive care refers to visits with your health care provider and lifestyle choices that can promote health and wellness. This includes:  A yearly physical exam. This may also be called an annual well check.  Regular dental visits and eye exams.  Immunizations.  Screening for certain conditions.  Healthy lifestyle choices, such as eating a healthy diet, getting regular exercise, not using drugs or products that contain nicotine and tobacco, and limiting alcohol use. What can I expect for my preventive care visit? Physical exam Your health care provider will check your:  Height and weight. This may be used to calculate body mass index (BMI), which tells if you are at a healthy weight.  Heart rate and blood pressure.  Skin for abnormal spots. Counseling Your health care provider may ask you questions about your:  Alcohol, tobacco, and drug use.  Emotional well-being.  Home and relationship well-being.  Sexual activity.  Eating habits.  Work and work environment.  Method of birth control.  Menstrual cycle.  Pregnancy history. What immunizations do I need?  Influenza (flu) vaccine  This is recommended every year. Tetanus, diphtheria, and pertussis (Tdap) vaccine  You may need a Td booster every 10 years. Varicella (chickenpox) vaccine  You may need this if you have not been vaccinated. Human papillomavirus (HPV) vaccine  If recommended by your health care provider, you may need three doses over 6 months. Measles, mumps, and rubella (MMR) vaccine  You may need at least one dose of MMR. You may also need a second dose. Meningococcal conjugate (MenACWY) vaccine  One dose is recommended if you are age 19-21 years and a first-year college student living in a residence hall, or if you have one of several medical conditions. You may also need additional booster doses. Pneumococcal conjugate (PCV13) vaccine  You may need  this if you have certain conditions and were not previously vaccinated. Pneumococcal polysaccharide (PPSV23) vaccine  You may need one or two doses if you smoke cigarettes or if you have certain conditions. Hepatitis A vaccine  You may need this if you have certain conditions or if you travel or work in places where you may be exposed to hepatitis A. Hepatitis B vaccine  You may need this if you have certain conditions or if you travel or work in places where you may be exposed to hepatitis B. Haemophilus influenzae type b (Hib) vaccine  You may need this if you have certain conditions. You may receive vaccines as individual doses or as more than one vaccine together in one shot (combination vaccines). Talk with your health care provider about the risks and benefits of combination vaccines. What tests do I need?  Blood tests  Lipid and cholesterol levels. These may be checked every 5 years starting at age 20.  Hepatitis C test.  Hepatitis B test. Screening  Diabetes screening. This is done by checking your blood sugar (glucose) after you have not eaten for a while (fasting).  Sexually transmitted disease (STD) testing.  BRCA-related cancer screening. This may be done if you have a family history of breast, ovarian, tubal, or peritoneal cancers.  Pelvic exam and Pap test. This may be done every 3 years starting at age 21. Starting at age 30, this may be done every 5 years if you have a Pap test in combination with an HPV test. Talk with your health care provider about your test results, treatment options, and if necessary, the need for more tests.   Follow these instructions at home: Eating and drinking   Eat a diet that includes fresh fruits and vegetables, whole grains, lean protein, and low-fat dairy.  Take vitamin and mineral supplements as recommended by your health care provider.  Do not drink alcohol if: ? Your health care provider tells you not to drink. ? You are  pregnant, may be pregnant, or are planning to become pregnant.  If you drink alcohol: ? Limit how much you have to 0-1 drink a day. ? Be aware of how much alcohol is in your drink. In the [place] one drink equals one 12 oz bottle of beer (355 mL), one 5 oz glass of wine (148 mL), or one 1 oz glass of hard liquor (44 mL). Lifestyle  Take daily care of your teeth and gums.  Stay active. Exercise for at least 30 minutes on 5 or more days each week.  Do not use any products that contain nicotine or tobacco, such as cigarettes, e-cigarettes, and chewing tobacco. If you need help quitting, ask your health care provider.  If you are sexually active, practice safe sex. Use a condom or other form of birth control (contraception) in order to prevent pregnancy and STIs (sexually transmitted infections). If you plan to become pregnant, see your health care provider for a preconception visit. What's next?  Visit your health care provider once a year for a well check visit.  Ask your health care provider how often you should have your eyes and teeth checked.  Stay up to date on all vaccines. This information is not intended to replace advice given to you by your health care provider. Make sure you discuss any questions you have with your health care provider. Document Revised: 12/09/2017 Document Reviewed: 12/09/2017 Elsevier Patient Education  2020 [employer].

## 2019-05-10 NOTE — Progress Notes (Signed)
GYNECOLOGY ANNUAL PREVENTATIVE CARE ENCOUNTER NOTE  History:     Daisy Hancock is a 34 y.o. [phone] female here for a routine annual gynecologic exam.  Current complaints: nonr.   Denies abnormal vaginal bleeding, discharge, pelvic pain, problems with intercourse or other gynecologic concerns.     Social History: Sexual: heterosexual Marital Status: married Living situation: with family Occupation: homemaker Tobacco/alcohol: no tobacco use Illicit drugs: no history of illicit drug use  Gynecologic History Patient's last menstrual period was 05/03/2019 (exact date). Contraception: OCP (estrogen/progesterone) Last Pap: 04/29/2018. Results were: normal with negative HPV Last mammogram: n/a . Result  Obstetric History OB History  Gravida Para Term Preterm AB Living  4 2 2   2 2   SAB TAB Ectopic Multiple Live Births  2     0 2    # Outcome Date GA Lbr Len/2nd Weight Sex Delivery Anes PTL Lv  4 Term 12/16/17 [redacted]w[redacted]d / 00:18 9 lb 6.3 oz (4.26 kg) M Vag-Spont Local  LIV  3 Term 03/13/14    F Vag-Spont   LIV  2 SAB           1 SAB             Past Medical History:  Diagnosis Date  . Anxiety   . Depression   . Thyroid disease   . Vitamin D deficiency     Past Surgical History:  Procedure Laterality Date  . NO PAST SURGERIES      Current Outpatient Medications on File Prior to Visit  Medication Sig Dispense Refill  . Cholecalciferol (VITAMIN D) 2000 units tablet Take 2,000 Units by mouth daily.    . COLLAGEN PO Take by mouth.    . cyanocobalamin (,VITAMIN B-12,) 1000 MCG/ML injection Inject 1 mL (1,000 mcg total) into the muscle every 30 (thirty) days. 10 mL 1  . cyclobenzaprine (FLEXERIL) 10 MG tablet Take 1 tablet (10 mg total) by mouth 2 (two) times daily as needed for muscle spasms. Do not drive while taking as can cause drowsiness (Patient not taking: Reported on 03/06/2019) 15 tablet 0  . diclofenac (VOLTAREN) 75 MG EC tablet     . levothyroxine (SYNTHROID) 50  MCG tablet TAKE 1 TABLET(50 MCG) BY MOUTH DAILY BEFORE BREAKFAST 90 tablet 3  . loratadine (CLARITIN) 10 MG tablet Take 10 mg by mouth daily.    . phentermine (ADIPEX-P) 37.5 MG tablet Take 1 tablet (37.5 mg total) by mouth daily before breakfast. 30 tablet 0  . SPRINTEC 28 0.25-35 MG-MCG tablet Take 1 tablet by mouth daily. 3 Package 11   No current facility-administered medications on file prior to visit.    Allergies  Allergen Reactions  . Ceclor [Cefaclor]   . Prednisone Other (See Comments)    suicidal  . Sulfa Antibiotics     GI issue    Social History:  reports that she has never smoked. She has never used smokeless tobacco. She reports current alcohol use. She reports that she does not use drugs.  Family History  Problem Relation Age of Onset  . Heart disease Maternal Grandfather     The following portions of the patient's history were reviewed and updated as appropriate: allergies, current medications, past family history, past medical history, past social history, past surgical history and problem list.  Review of Systems Pertinent items noted in HPI and remainder of comprehensive ROS otherwise negative.  Physical Exam:  There were no vitals taken for this visit. CONSTITUTIONAL: Well-developed, well-nourished  female in no acute distress.  HENT:  Normocephalic, atraumatic, External right and left ear normal. Oropharynx is clear and moist EYES: Conjunctivae and EOM are normal. Pupils are equal, round, and reactive to light. No scleral icterus.  NECK: Normal range of motion, supple, no masses.  Normal thyroid.  SKIN: Skin is warm and dry. No rash noted. Not diaphoretic. No erythema. No pallor. MUSCULOSKELETAL: Normal range of motion. No tenderness.  No cyanosis, clubbing, or edema.  2+ distal pulses. NEUROLOGIC: Alert and oriented to person, place, and time. Normal reflexes, muscle tone coordination.  PSYCHIATRIC: Normal mood and affect. Normal behavior. Normal judgment  and thought content. CARDIOVASCULAR: Normal heart rate noted, regular rhythm RESPIRATORY: Clear to auscultation bilaterally. Effort and breath sounds normal, no problems with respiration noted. BREASTS: Symmetric in size. No masses, tenderness, skin changes, nipple drainage, or lymphadenopathy bilaterally. ABDOMEN: Soft, no distention noted.  No tenderness, rebound or guarding.  PELVIC: Normal appearing external genitalia and urethral meatus; normal appearing vaginal mucosa and cervix.  No abnormal discharge noted.  Pap smear obtained.  Normal uterine size, no other palpable masses, no uterine or adnexal tenderness.   Assessment and Plan:    1. Women's annual routine gynecological examination  Pap smear not indicated until 2025 Mammogram n/a  Labs TSH Refill BC Referral to endocrinology, hx; of hyperthyroid Routine preventative health maintenance measures emphasized. Please refer to After Visit Summary for other counseling recommendations.   Philip Aspen, CNM

## 2019-05-11 LAB — THYROID PANEL WITH TSH
Free Thyroxine Index: 2.5 (ref 1.2–4.9)
T3 Uptake Ratio: 26 % (ref 24–39)
T4, Total: 9.6 ug/dL (ref 4.5–12.0)
TSH: 0.898 u[IU]/mL (ref 0.450–4.500)

## 2019-05-22 ENCOUNTER — Encounter: Admitting: Certified Nurse Midwife

## 2019-05-22 ENCOUNTER — Encounter: Payer: Self-pay | Admitting: Certified Nurse Midwife

## 2019-05-22 ENCOUNTER — Other Ambulatory Visit: Payer: Self-pay

## 2019-05-22 ENCOUNTER — Ambulatory Visit (INDEPENDENT_AMBULATORY_CARE_PROVIDER_SITE_OTHER): Admitting: Certified Nurse Midwife

## 2019-05-22 VITALS — BP 107/68 | HR 92 | Ht 65.0 in | Wt 171.5 lb

## 2019-05-22 DIAGNOSIS — Z6828 Body mass index (BMI) 28.0-28.9, adult: Secondary | ICD-10-CM

## 2019-05-22 DIAGNOSIS — Z7689 Persons encountering health services in other specified circumstances: Secondary | ICD-10-CM

## 2019-05-22 DIAGNOSIS — D519 Vitamin B12 deficiency anemia, unspecified: Secondary | ICD-10-CM

## 2019-05-22 DIAGNOSIS — E669 Obesity, unspecified: Secondary | ICD-10-CM

## 2019-05-22 MED ORDER — PHENTERMINE HCL 37.5 MG PO TABS: 37.5000 mg | ORAL_TABLET | Freq: Every day | ORAL | 0 refills | Status: DC

## 2019-05-22 MED ORDER — CYANOCOBALAMIN 1000 MCG/ML IJ SOLN
1000.0000 ug | Freq: Once | INTRAMUSCULAR | Status: AC
  Administered 2019-05-22: 09:00:00 1000 ug via INTRAMUSCULAR

## 2019-05-22 NOTE — Patient Instructions (Signed)

## 2019-05-22 NOTE — Progress Notes (Signed)
SUBJECTIVE:  34 y.o. here for follow-up weight loss visit, previously seen 4 weeks ago. Denies any concerns and feels like medication is working well. She has lost 3.2 lbs since her last visit. She is doing [employer] 80 day obsession work out and [employer]. She has lost a total of 13.2 lbs since her start in September. Her goal weight is 150lbs. Discussed continuing medication for up to a year with continued wt loss. She verbalizes and agrees to plan.   OBJECTIVE:  BP 107/68   Pulse 92   Ht 5\' 5"  (1.651 m)   Wt 171 lb 8 oz (77.8 kg)   LMP 05/03/2019 (Exact Date)   BMI 28.54 kg/m   Body mass index is 28.54 kg/m. Patient appears well. ASSESSMENT:  Obesity- responding well to weight loss plan PLAN:  To continue with current medications. B12 1049mcg/ml injection given RTC in 4 weeks as planned  80m, CNM

## 2019-06-21 ENCOUNTER — Other Ambulatory Visit: Payer: Self-pay

## 2019-06-21 ENCOUNTER — Encounter: Payer: Self-pay | Admitting: Certified Nurse Midwife

## 2019-06-21 ENCOUNTER — Ambulatory Visit (INDEPENDENT_AMBULATORY_CARE_PROVIDER_SITE_OTHER): Admitting: Certified Nurse Midwife

## 2019-06-21 VITALS — BP 105/71 | HR 98 | Ht 65.0 in | Wt 166.0 lb

## 2019-06-21 DIAGNOSIS — Z7689 Persons encountering health services in other specified circumstances: Secondary | ICD-10-CM

## 2019-06-21 MED ORDER — CYANOCOBALAMIN 1000 MCG/ML IJ SOLN
1000.0000 ug | Freq: Once | INTRAMUSCULAR | Status: AC
  Administered 2019-06-21: 1000 ug via INTRAMUSCULAR

## 2019-06-21 MED ORDER — PHENTERMINE HCL 37.5 MG PO TABS: 37.5000 mg | ORAL_TABLET | Freq: Every day | ORAL | 0 refills | Status: DC

## 2019-06-21 NOTE — Patient Instructions (Signed)

## 2019-06-21 NOTE — Progress Notes (Signed)
SUBJECTIVE:  34 y.o. here for follow-up weight loss visit, previously seen 4 weeks ago. Denies any concerns and feels like medication is working well. She has lost 5.8 lbs this visit.   OBJECTIVE:  BP 105/71   Pulse 98   Ht 5\' 5"  (1.651 m)   Wt 166 lb (75.3 kg)   BMI 27.62 kg/m   Body mass index is 27.62 kg/m. Patient appears well. ASSESSMENT:  Obesity- responding well to weight loss plan PLAN:  To continue with current medications. B12 1011mcg/ml injection given RTC in 4 weeks as planned  I attest more than 50% of visit spent reviewing last month diet, exercise, side effects of medications and encouraging pt. Face to face time 10 min.   80m, CNM

## 2019-07-12 ENCOUNTER — Encounter: Payer: Self-pay | Admitting: Certified Nurse Midwife

## 2019-07-12 ENCOUNTER — Other Ambulatory Visit: Payer: Self-pay

## 2019-07-12 ENCOUNTER — Ambulatory Visit (INDEPENDENT_AMBULATORY_CARE_PROVIDER_SITE_OTHER): Admitting: Certified Nurse Midwife

## 2019-07-12 VITALS — BP 117/79 | HR 107 | Ht 65.0 in | Wt 163.2 lb

## 2019-07-12 DIAGNOSIS — E538 Deficiency of other specified B group vitamins: Secondary | ICD-10-CM

## 2019-07-12 DIAGNOSIS — Z713 Dietary counseling and surveillance: Secondary | ICD-10-CM | POA: Diagnosis not present

## 2019-07-12 DIAGNOSIS — E669 Obesity, unspecified: Secondary | ICD-10-CM

## 2019-07-12 DIAGNOSIS — Z7689 Persons encountering health services in other specified circumstances: Secondary | ICD-10-CM

## 2019-07-12 MED ORDER — CYANOCOBALAMIN 1000 MCG/ML IJ SOLN
1000.0000 ug | Freq: Once | INTRAMUSCULAR | Status: AC
  Administered 2019-07-12: 1000 ug via INTRAMUSCULAR

## 2019-07-12 MED ORDER — CYANOCOBALAMIN 1000 MCG/ML IJ SOLN: 1000.0000 ug | INTRAMUSCULAR | 1 refills | Status: DC

## 2019-07-12 MED ORDER — PHENTERMINE HCL 37.5 MG PO TABS: 37.5000 mg | ORAL_TABLET | Freq: Every day | ORAL | 0 refills | Status: DC

## 2019-07-12 NOTE — Progress Notes (Signed)
Pt presents for  Weight,B/P, B-12 injection. No side effects of medications-Phentermine or B-12. Weight loss 3.7 lbs lbs. Encourage eating healthy and exercise. Waist circum- 35 "

## 2019-07-12 NOTE — Progress Notes (Signed)
SUBJECTIVE:  34 y.o. here for follow-up weight loss visit, previously seen 4 weeks ago. Denies any concerns and feels like medication is working well. Denies any negative side effects has lost 3 more pounds. Goal is 150 lbs .   OBJECTIVE:  BP 117/79   Pulse (!) 107   Ht 5\' 5"  (1.651 m)   Wt 163 lb 3 oz (74 kg)   BMI 27.16 kg/m   Body mass index is 27.16 kg/m. Patient appears well. ASSESSMENT:  Obesity- responding well to weight loss plan PLAN:  To continue with current medications. B12 1037mcg/ml injection given RTC in 4 weeks as planned  I attest more than 50% of visit spent reviewing history, discussing progress and if any side effects of medication , discussing diet and exercise . Face to face time 10 min.   80m, CNM

## 2019-07-12 NOTE — Patient Instructions (Signed)

## 2019-08-09 ENCOUNTER — Encounter: Admitting: Certified Nurse Midwife

## 2019-08-16 ENCOUNTER — Ambulatory Visit (INDEPENDENT_AMBULATORY_CARE_PROVIDER_SITE_OTHER): Admitting: Certified Nurse Midwife

## 2019-08-16 ENCOUNTER — Other Ambulatory Visit: Payer: Self-pay

## 2019-08-16 DIAGNOSIS — E669 Obesity, unspecified: Secondary | ICD-10-CM

## 2019-08-16 MED ORDER — CYANOCOBALAMIN 1000 MCG/ML IJ SOLN
1000.0000 ug | Freq: Once | INTRAMUSCULAR | Status: AC
  Administered 2019-08-16: 11:00:00 1000 ug via INTRAMUSCULAR

## 2019-08-16 MED ORDER — CYANOCOBALAMIN 1000 MCG/ML IJ SOLN: 1000.0000 ug | Freq: Once | INTRAMUSCULAR | Status: DC

## 2019-08-16 MED ORDER — PHENTERMINE HCL 37.5 MG PO TABS: 37.5000 mg | ORAL_TABLET | Freq: Every day | ORAL | 0 refills | Status: DC

## 2019-08-16 NOTE — Progress Notes (Signed)
Pt presents for  Weight,B/P, B-12 injection. No side effects of medications-Phentermine or B-12. Weight loss 0.5_ lbs. Encourage eating healthy and exercise.  Patient "took a break" for a few weeks due to contact with [place], [place] and a sunburn. She states she still has exercised and is watching what she is eating. She will need a refill of Phentermine sent to her preferred pharmacy. Waist circumference= 35.5 " Provider was unable to see patient due to being called to hospital for a delivery.

## 2019-08-17 ENCOUNTER — Inpatient Hospital Stay: Admission: RE | Admit: 2019-08-17 | Source: Ambulatory Visit

## 2019-08-17 ENCOUNTER — Ambulatory Visit (INDEPENDENT_AMBULATORY_CARE_PROVIDER_SITE_OTHER)
Admission: RE | Admit: 2019-08-17 | Discharge: 2019-08-17 | Disposition: A | Discharge status: Home / Self Care | Source: Ambulatory Visit

## 2019-08-17 ENCOUNTER — Other Ambulatory Visit: Payer: Self-pay

## 2019-08-17 DIAGNOSIS — J029 Acute pharyngitis, unspecified: Secondary | ICD-10-CM | POA: Diagnosis not present

## 2019-08-17 DIAGNOSIS — R05 Cough: Secondary | ICD-10-CM

## 2019-08-17 DIAGNOSIS — M542 Cervicalgia: Secondary | ICD-10-CM

## 2019-08-17 DIAGNOSIS — R059 Cough, unspecified: Secondary | ICD-10-CM

## 2019-08-17 MED ORDER — BENZONATATE 200 MG PO CAPS: 200.0000 mg | ORAL_CAPSULE | Freq: Three times a day (TID) | ORAL | 0 refills | Status: DC | PRN

## 2019-08-17 MED ORDER — LIDOCAINE VISCOUS HCL 2 % MT SOLN: OROMUCOSAL | 0 refills | Status: DC

## 2019-08-17 MED ORDER — TIZANIDINE HCL 2 MG PO TABS
2.0000 mg | ORAL_TABLET | Freq: Three times a day (TID) | ORAL | 0 refills | Status: DC | PRN
Start: 2019-08-17 — End: 2019-10-18

## 2019-08-17 NOTE — ED Provider Notes (Signed)
Virtual Visit via Video Note:  Daisy Hancock  initiated request for Telemedicine visit with [facility] Urgent [facility] team. I connected with Daisy Hancock  on 08/17/2019 at 4:16 PM  for a synchronized telemedicine visit using a video enabled HIPPA compliant telemedicine application. I verified that I am speaking with Daisy Hancock  using two identifiers. Daisy Doree Fudge, PA-C  was physically located in a [facility] Urgent care site and Syra Sirmons was located at a different location.   The limitations of evaluation and management by telemedicine as well as the availability of in-person appointments were discussed. Patient was informed that she  may incur a bill ( including co-pay) for this virtual visit encounter. Daisy Hancock  expressed understanding and gave verbal consent to proceed with virtual visit.     History of Present Illness:Daisy Hancock  is a 34 y.o. female presents with 2 day history of URI symptoms. Has had cough, sore throat, headache, neck tightness. Denies rhinorrhea, nasal congestion. Denies fever, chills, body aches. Denies abdominal pain, nausea, vomiting, diarrhea. Denies shortness of breath, loss of taste/smell. No COVID vaccine.    Past Medical History:  Diagnosis Date  . Anxiety   . Depression   . Thyroid disease   . Vitamin D deficiency     Allergies  Allergen Reactions  . Ceclor [Cefaclor]   . Prednisone Other (See Comments)    suicidal  . Sulfa Antibiotics     GI issue        Observations/Objective: (was able to see patient on video, but image froze, visit was finished through telephone) General: Well appearing, nontoxic, no acute distress. Sitting comfortably. Head: Normocephalic, atraumatic Pulm: Speaking in full sentences without difficulty. Normal effort. No respiratory distress. Neuro: Normal mental status. Alert and oriented x 3.   Assessment and Plan: Discussed cannot rule out COVID causing symptoms, provided  drive through information for testing. Quarantine until testing results return. Symptomatic treatment discussed and provided. Push fluids. Return precautions given. Patient expresses understanding and agrees to plan.  Follow Up Instructions:    I discussed the assessment and treatment plan with the patient. The patient was provided an opportunity to ask questions and all were answered. The patient agreed with the plan and demonstrated an understanding of the instructions.   The patient was advised to call back or seek an in-person evaluation if the symptoms worsen or if the condition fails to improve as anticipated.  I provided 15 minutes of non-face-to-face time during this encounter.    Belinda Fisher, PA-C  08/17/2019 4:16 PM         Belinda Fisher, PA-C 08/17/19 1703

## 2019-08-17 NOTE — Discharge Instructions (Signed)
As discussed, cannot rule out COVID causing symptoms. Testing ordered, please schedule appointment with testing site. I would like you to quarantine until testing results. You can take over the counter flonase/nasacort to help with nasal congestion/drainage. Tessalon for cough. Start lidocaine for sore throat, do not eat or drink for the next 40 mins after use as it can stunt your gag reflex.  Tizanidine as needed for neck pain.  Tizanidine as a muscle relaxer, this can make you drowsy, do not drive after taking this medicine.  If experiencing shortness of breath, trouble breathing, go to the emergency department for further evaluation needed.   To make appointment: You can text "COVID" to [phone] OR log on to [email or web address] If no smart phone or PC, you can call [phone] for assistance in setting up appointments.  COVID drive through sites:  [place]: [address] [place]: [address] [place]: [address] [facility] at [facility] [address] Evonnie Dawes [place], [place] [address] [phone]  [facility] at [facility] [address], [place], [place] [address] [phone]

## 2019-09-13 ENCOUNTER — Encounter

## 2019-09-14 ENCOUNTER — Encounter

## 2019-09-14 NOTE — Progress Notes (Signed)
Pt present for weight check. Pt last visit 07/12/2019 wt was 163lb.  Waist 36 inches. Pt needs refill of phenetamine sent to [facility] [address] and [employer].

## 2019-09-15 ENCOUNTER — Other Ambulatory Visit: Payer: Self-pay

## 2019-09-15 ENCOUNTER — Ambulatory Visit (INDEPENDENT_AMBULATORY_CARE_PROVIDER_SITE_OTHER)

## 2019-09-15 VITALS — BP 113/76 | HR 96 | Ht 65.0 in | Wt 161.4 lb

## 2019-09-15 DIAGNOSIS — Z7689 Persons encountering health services in other specified circumstances: Secondary | ICD-10-CM | POA: Diagnosis not present

## 2019-09-15 MED ORDER — CYANOCOBALAMIN 1000 MCG/ML IJ SOLN
1000.0000 ug | Freq: Once | INTRAMUSCULAR | Status: AC
  Administered 2019-09-15: 1000 ug via INTRAMUSCULAR

## 2019-09-20 ENCOUNTER — Other Ambulatory Visit: Payer: Self-pay | Admitting: Certified Nurse Midwife

## 2019-09-20 MED ORDER — PHENTERMINE HCL 37.5 MG PO TABS: 37.5000 mg | ORAL_TABLET | Freq: Every day | ORAL | 0 refills | Status: DC

## 2019-10-18 ENCOUNTER — Other Ambulatory Visit: Payer: Self-pay

## 2019-10-18 ENCOUNTER — Encounter: Payer: Self-pay | Admitting: Certified Nurse Midwife

## 2019-10-18 ENCOUNTER — Ambulatory Visit (INDEPENDENT_AMBULATORY_CARE_PROVIDER_SITE_OTHER): Admitting: Certified Nurse Midwife

## 2019-10-18 DIAGNOSIS — E669 Obesity, unspecified: Secondary | ICD-10-CM

## 2019-10-18 MED ORDER — PHENTERMINE HCL 37.5 MG PO TABS: 37.5000 mg | ORAL_TABLET | Freq: Every day | ORAL | 0 refills | Status: DC

## 2019-10-18 MED ORDER — CYANOCOBALAMIN 1000 MCG/ML IJ SOLN
1000.0000 ug | Freq: Once | INTRAMUSCULAR | Status: AC
  Administered 2019-10-18: 1000 ug via INTRAMUSCULAR

## 2019-10-18 NOTE — Progress Notes (Signed)
SUBJECTIVE:  34 y.o. here for follow-up weight loss visit, previously seen 4 weeks ago. Denies any concerns and feels like medication is working she was on vacation this past week and gained a little but is back on her diet and exercise schedule.   OBJECTIVE:  BP 103/69   Pulse 88   Ht 5\' 5"  (1.651 m)   Wt 162 lb 1 oz (73.5 kg)   BMI 26.97 kg/m   Body mass index is 26.97 kg/m. Waist : 34    Patient appears well. ASSESSMENT:  Obesity- responding well to weight loss plan PLAN:  To continue with current medications. B12 104mcg/ml injection given RTC in 4 weeks as planned  80m, CNM

## 2019-10-18 NOTE — Patient Instructions (Signed)

## 2019-10-27 IMAGING — CR DG LUMBAR SPINE 2-3V
1 series · 3 of 3 positions shown · non-contrast
Comparison: None.

CLINICAL DATA: low back pain beginning during pregnancy, patient is
3 month post partum. Patient states pain is worse on right side and
worse when sitting down, pain is better upon standing upright.
Patient states no known recent injury or injuries in past.

EXAM:
LUMBAR SPINE - 2-3 VIEW

[Series 1: t lumbar spine ap · 0.14mm/px · 3 of 3 slices shown]
[im 1/3]
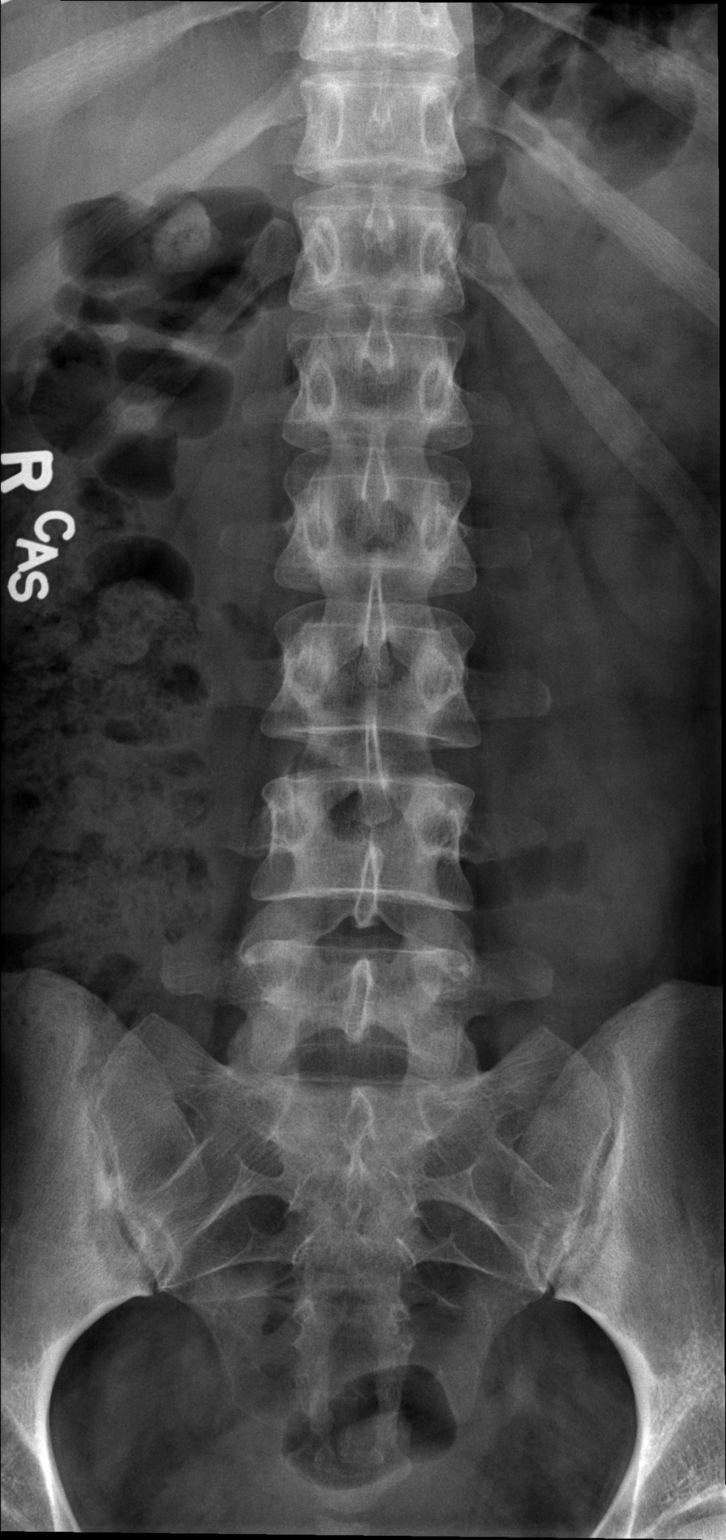
[im 2/3]
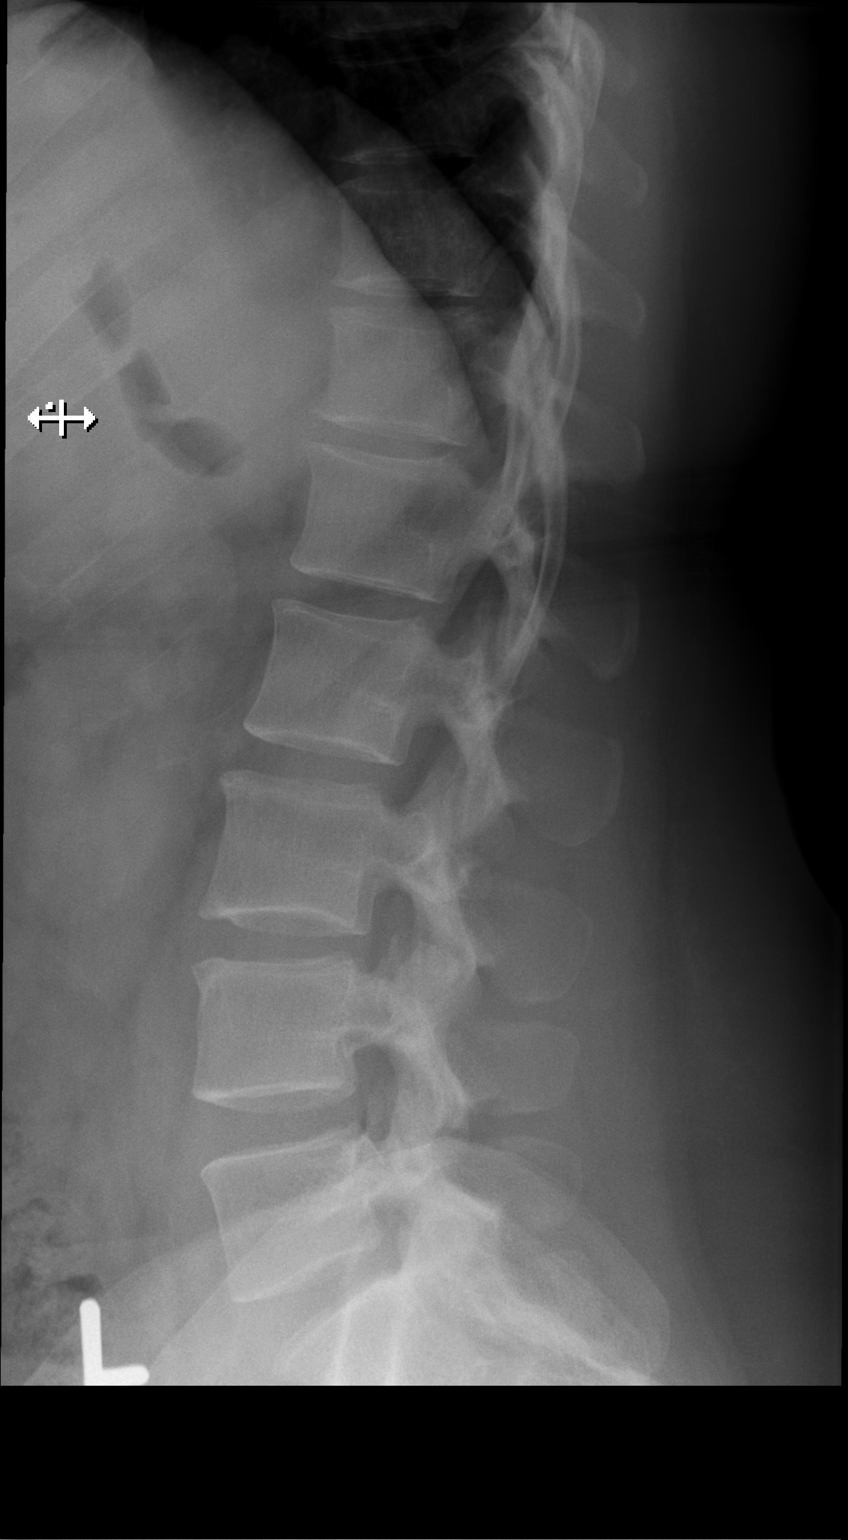
[im 3/3]
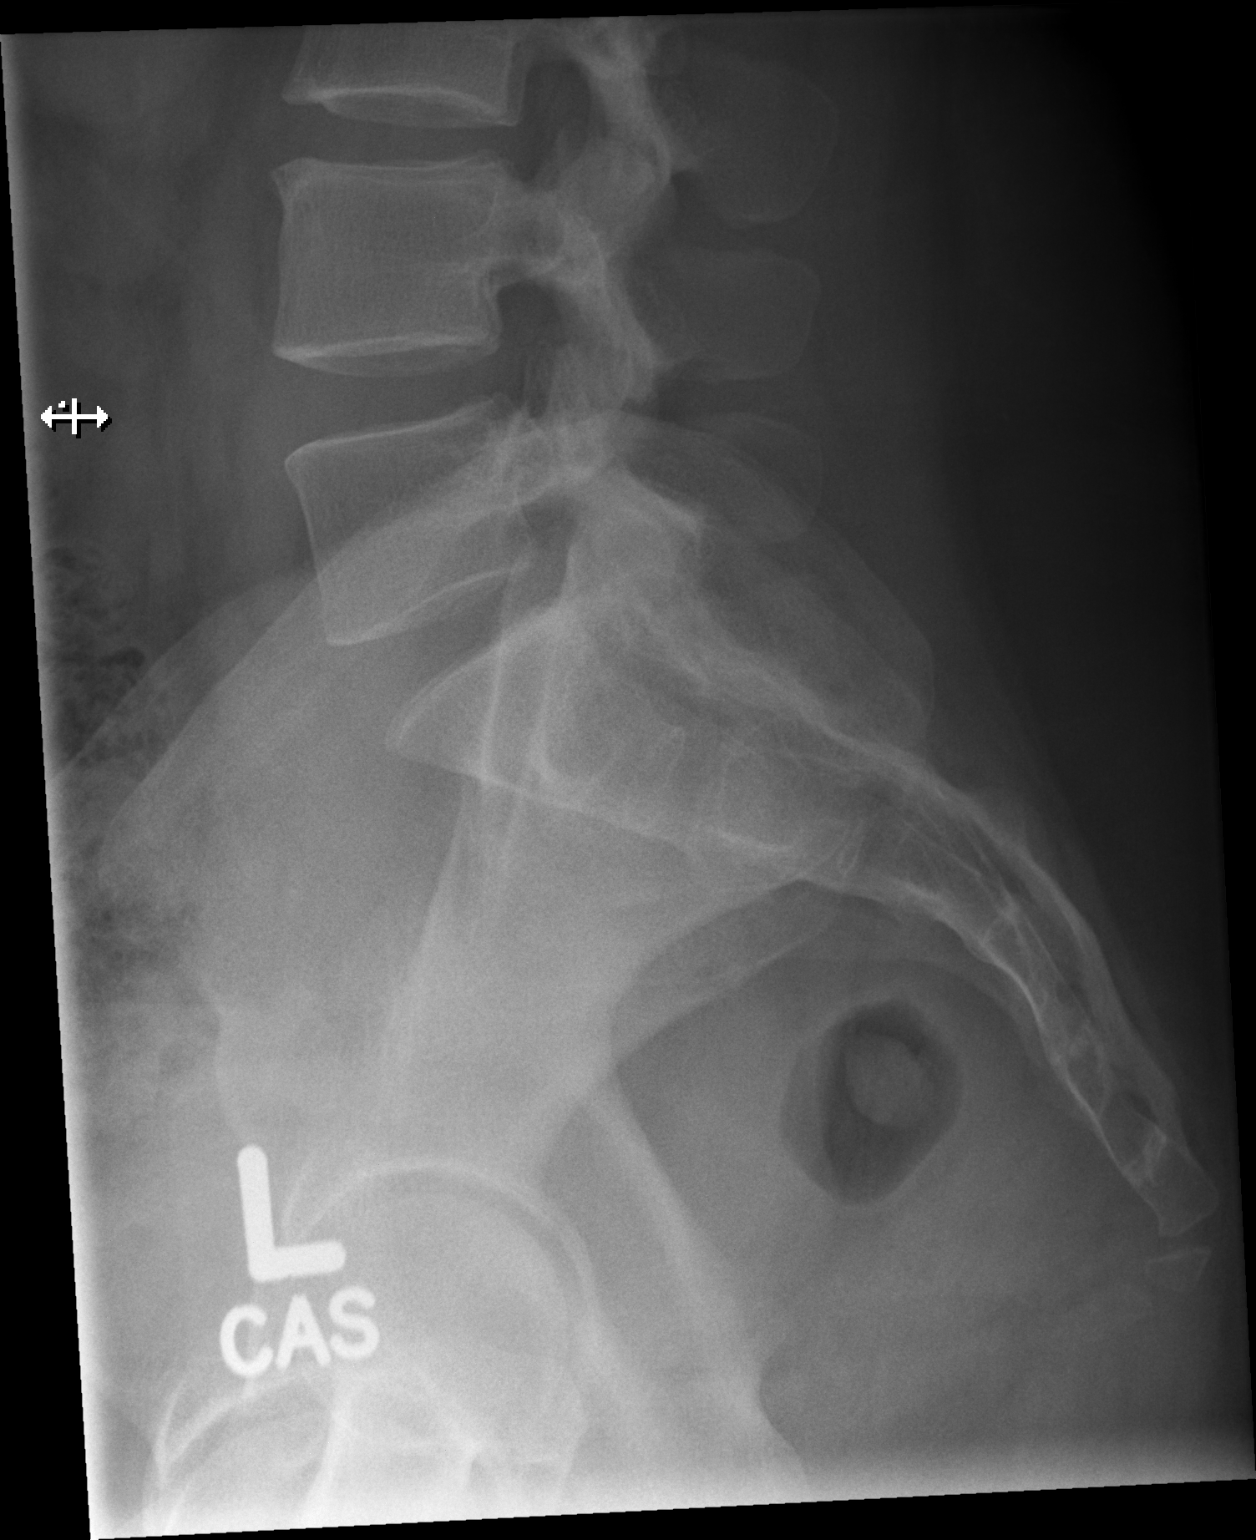

[3 of 3 positions shown; findings below may reference images not displayed]

FINDINGS: There is no evidence of lumbar spine fracture. Alignment is normal.
Intervertebral disc spaces are maintained.
IMPRESSION: Negative.

## 2019-11-21 ENCOUNTER — Ambulatory Visit (INDEPENDENT_AMBULATORY_CARE_PROVIDER_SITE_OTHER): Admitting: Certified Nurse Midwife

## 2019-11-21 ENCOUNTER — Encounter: Payer: Self-pay | Admitting: Certified Nurse Midwife

## 2019-11-21 VITALS — BP 82/56 | HR 105 | Ht 65.0 in | Wt 160.6 lb

## 2019-11-21 DIAGNOSIS — Z7689 Persons encountering health services in other specified circumstances: Secondary | ICD-10-CM

## 2019-11-21 DIAGNOSIS — E669 Obesity, unspecified: Secondary | ICD-10-CM

## 2019-11-21 MED ORDER — CYANOCOBALAMIN 1000 MCG/ML IJ SOLN
1000.0000 ug | Freq: Once | INTRAMUSCULAR | Status: AC
  Administered 2019-11-21: 1000 ug via INTRAMUSCULAR

## 2019-11-21 MED ORDER — PHENTERMINE HCL 37.5 MG PO TABS: 37.5000 mg | ORAL_TABLET | Freq: Every day | ORAL | 0 refills | Status: DC

## 2019-11-21 NOTE — Progress Notes (Signed)
WC 36"

## 2019-11-21 NOTE — Patient Instructions (Signed)

## 2019-11-21 NOTE — Addendum Note (Signed)
Addended by: Brooke Dare on: 11/21/2019 02:57 PM   Modules accepted: Orders

## 2019-11-21 NOTE — Progress Notes (Signed)
SUBJECTIVE:  34 y.o. here for follow-up weight loss visit, previously seen 4 weeks ago. Denies any concerns and feels like medication is working, she has had some recent life stress , mother breast cancer returned. Discussed stress in relationship to weight loss. Encoraged her to give herself some grace and take time for herself. Discussed wt loss program and recommendations for medication use x 1 yr. She is at her year mark in september. Will renew medication x 1 more month. She verbalizes and agrees. She has not yet met her goal weight. She is encouraged to continue with her diet and exercise to meet her goal.   OBJECTIVE:  BP (!) 82/56   Pulse (!) 105   Ht 5' 5"  (1.651 m)   Wt 160 lb 9 oz (72.8 kg)   BMI 26.72 kg/m   Body mass index is 26.72 kg/m. Patient appears well. She has lost 2 lbs.  Waist Cir 36"  ASSESSMENT:  Obesity- responding well to weight loss plan PLAN:  To continue with current medications. B12 1018mg/ml injection given RTC in PRN for annual exam   APhilip Aspen CNM

## 2019-12-20 ENCOUNTER — Other Ambulatory Visit: Payer: Self-pay

## 2019-12-20 ENCOUNTER — Encounter: Payer: Self-pay | Admitting: Certified Nurse Midwife

## 2019-12-20 ENCOUNTER — Ambulatory Visit (INDEPENDENT_AMBULATORY_CARE_PROVIDER_SITE_OTHER): Admitting: Certified Nurse Midwife

## 2019-12-20 VITALS — BP 97/66 | HR 92 | Ht 65.0 in | Wt 160.9 lb

## 2019-12-20 DIAGNOSIS — Z7689 Persons encountering health services in other specified circumstances: Secondary | ICD-10-CM | POA: Diagnosis not present

## 2019-12-20 NOTE — Patient Instructions (Signed)

## 2019-12-20 NOTE — Progress Notes (Signed)
SUBJECTIVE:  34 y.o. here for follow-up weight loss visit, previously seen 4 weeks ago. Denies any concerns and feels like medication is working well. Explained recommendations for use of phentermine for no more than 1 year. Pt encouraged to continue weight management to meet her goal.     OBJECTIVE:  BP 97/66   Pulse 92   Ht 5\' 5"  (1.651 m)   Wt 160 lb 14.4 oz (73 kg)   BMI 26.78 kg/m   Body mass index is 26.78 kg/m. Patient appears well. ASSESSMENT: no further medication used for management.   Follow up for annual or PRN  , Pattricia Boss

## 2020-05-15 ENCOUNTER — Encounter: Admitting: Certified Nurse Midwife

## 2020-06-11 ENCOUNTER — Other Ambulatory Visit: Payer: Self-pay

## 2020-06-11 ENCOUNTER — Encounter: Payer: Self-pay | Admitting: Certified Nurse Midwife

## 2020-06-11 ENCOUNTER — Ambulatory Visit (INDEPENDENT_AMBULATORY_CARE_PROVIDER_SITE_OTHER): Admitting: Certified Nurse Midwife

## 2020-06-11 VITALS — BP 98/59 | HR 83 | Ht 65.0 in | Wt 180.6 lb

## 2020-06-11 DIAGNOSIS — Z1159 Encounter for screening for other viral diseases: Secondary | ICD-10-CM

## 2020-06-11 DIAGNOSIS — Z01419 Encounter for gynecological examination (general) (routine) without abnormal findings: Secondary | ICD-10-CM

## 2020-06-11 MED ORDER — SPRINTEC 28 0.25-35 MG-MCG PO TABS: 1.0000 | ORAL_TABLET | Freq: Every day | ORAL | 11 refills | Status: DC

## 2020-06-11 NOTE — Patient Instructions (Signed)
Preventive Care 21-36 Years Old, Female Preventive care refers to lifestyle choices and visits with your health care provider that can promote health and wellness. This includes:  A yearly physical exam. This is also called an annual wellness visit.  Regular dental and eye exams.  Immunizations.  Screening for certain conditions.  Healthy lifestyle choices, such as: ? Eating a healthy diet. ? Getting regular exercise. ? Not using drugs or products that contain nicotine and tobacco. ? Limiting alcohol use. What can I expect for my preventive care visit? Physical exam Your health care provider may check your:  Height and weight. These may be used to calculate your BMI (body mass index). BMI is a measurement that tells if you are at a healthy weight.  Heart rate and blood pressure.  Body temperature.  Skin for abnormal spots. Counseling Your health care provider may ask you questions about your:  Past medical problems.  Family's medical history.  Alcohol, tobacco, and drug use.  Emotional well-being.  Home life and relationship well-being.  Sexual activity.  Diet, exercise, and sleep habits.  Work and work environment.  Access to firearms.  Method of birth control.  Menstrual cycle.  Pregnancy history. What immunizations do I need? Vaccines are usually given at various ages, according to a schedule. Your health care provider will recommend vaccines for you based on your age, medical history, and lifestyle or other factors, such as travel or where you work.   What tests do I need? Blood tests  Lipid and cholesterol levels. These may be checked every 5 years starting at age 20.  Hepatitis C test.  Hepatitis B test. Screening  Diabetes screening. This is done by checking your blood sugar (glucose) after you have not eaten for a while (fasting).  STD (sexually transmitted disease) testing, if you are at risk.  BRCA-related cancer screening. This may be  done if you have a family history of breast, ovarian, tubal, or peritoneal cancers.  Pelvic exam and Pap test. This may be done every 3 years starting at age 21. Starting at age 30, this may be done every 5 years if you have a Pap test in combination with an HPV test. Talk with your health care provider about your test results, treatment options, and if necessary, the need for more tests.   Follow these instructions at home: Eating and drinking  Eat a healthy diet that includes fresh fruits and vegetables, whole grains, lean protein, and low-fat dairy products.  Take vitamin and mineral supplements as recommended by your health care provider.  Do not drink alcohol if: ? Your health care provider tells you not to drink. ? You are pregnant, may be pregnant, or are planning to become pregnant.  If you drink alcohol: ? Limit how much you have to 0-1 drink a day. ? Be aware of how much alcohol is in your drink. In the [place] one drink equals one 12 oz bottle of beer (355 mL), one 5 oz glass of wine (148 mL), or one 1 oz glass of hard liquor (44 mL).   Lifestyle  Take daily care of your teeth and gums. Brush your teeth every morning and night with fluoride toothpaste. Floss one time each day.  Stay active. Exercise for at least 30 minutes 5 or more days each week.  Do not use any products that contain nicotine or tobacco, such as cigarettes, e-cigarettes, and chewing tobacco. If you need help quitting, ask your health care provider.  Do not   use drugs.  If you are sexually active, practice safe sex. Use a condom or other form of protection to prevent STIs (sexually transmitted infections).  If you do not wish to become pregnant, use a form of birth control. If you plan to become pregnant, see your health care provider for a prepregnancy visit.  Find healthy ways to cope with stress, such as: ? Meditation, yoga, or listening to music. ? Journaling. ? Talking to a trusted  person. ? Spending time with friends and family. Safety  Always wear your seat belt while driving or riding in a vehicle.  Do not drive: ? If you have been drinking alcohol. Do not ride with someone who has been drinking. ? When you are tired or distracted. ? While texting.  Wear a helmet and other protective equipment during sports activities.  If you have firearms in your house, make sure you follow all gun safety procedures.  Seek help if you have been physically or sexually abused. What's next?  Go to your health care provider once a year for an annual wellness visit.  Ask your health care provider how often you should have your eyes and teeth checked.  Stay up to date on all vaccines. This information is not intended to replace advice given to you by your health care provider. Make sure you discuss any questions you have with your health care provider. Document Revised: 11/26/2019 Document Reviewed: 12/09/2017 Elsevier Patient Education  2021 Elsevier Inc.  

## 2020-06-11 NOTE — Progress Notes (Signed)
GYNECOLOGY ANNUAL PREVENTATIVE CARE ENCOUNTER NOTE  History:     Daisy Hancock is a 35 y.o. [phone] female here for a routine annual gynecologic exam.  Current complaints: decreased libido.   Denies abnormal vaginal bleeding, discharge, pelvic pain, problems with intercourse or other gynecologic concerns.     Social Relationship: married  Living:spouse and children  Work: stay at home mom Exercise: 5 times wk Smoke/Alcohol/drug use: rare alcohol, denies smoking and drug use  Gynecologic History No LMP recorded (lmp unknown). (Menstrual status: Oral contraceptives). Contraception: OCP (estrogen/progesterone) Last Pap: 04/30/19 Results were: normal with negative HPV Last mammogram: n/a .    Upstream - 06/11/20 1014      Pregnancy Intention Screening   Does the patient want to become pregnant in the next year? Unsure    Does the patient's partner want to become pregnant in the next year? Unsure    Would the patient like to discuss contraceptive options today? Yes          The pregnancy intention screening data noted above was reviewed. Potential methods of contraception were discussed. The patient elected to proceed with Oral Contraceptive.    Obstetric History OB History  Gravida Para Term Preterm AB Living  4 2 2   2 2   SAB IAB Ectopic Multiple Live Births  2     0 2    # Outcome Date GA Lbr Len/2nd Weight Sex Delivery Anes PTL Lv  4 Term 12/16/17 [redacted]w[redacted]d / 00:18 9 lb 6.3 oz (4.26 kg) M Vag-Spont Local  LIV  3 Term 03/13/14    F Vag-Spont   LIV  2 SAB           1 SAB             Past Medical History:  Diagnosis Date  . Anxiety   . Depression   . Thyroid disease   . Vitamin D deficiency     Past Surgical History:  Procedure Laterality Date  . NO PAST SURGERIES      Current Outpatient Medications on File Prior to Visit  Medication Sig Dispense Refill  . ALPRAZolam (XANAX) 0.5 MG tablet Take 0.5 mg by mouth at bedtime as needed for anxiety.    .  Cholecalciferol (VITAMIN D) 2000 units tablet Take 4,000 Units by mouth daily.     . COLLAGEN PO Take by mouth.    . cyanocobalamin (,VITAMIN B-12,) 1000 MCG/ML injection Inject 1 mL (1,000 mcg total) into the muscle every 30 (thirty) days. 10 mL 1  . diclofenac (VOLTAREN) 75 MG EC tablet     . levothyroxine (SYNTHROID) 50 MCG tablet TAKE 1 TABLET(50 MCG) BY MOUTH DAILY BEFORE BREAKFAST 90 tablet 3  . loratadine (CLARITIN) 10 MG tablet Take 10 mg by mouth daily.    . phentermine (ADIPEX-P) 37.5 MG tablet Take 1 tablet (37.5 mg total) by mouth daily before breakfast. 30 tablet 0  . SPRINTEC 28 0.25-35 MG-MCG tablet Take 1 tablet by mouth daily. 3 Package 11   No current facility-administered medications on file prior to visit.    Allergies  Allergen Reactions  . Ceclor [Cefaclor]   . Prednisone Other (See Comments)    suicidal  . Sulfa Antibiotics     GI issue    Social History:  reports that she has never smoked. She has never used smokeless tobacco. She reports current alcohol use. She reports that she does not use drugs.  Family History  Problem Relation Age  of Onset  . Heart disease Maternal Grandfather   . Colon cancer Maternal Grandfather   . Breast cancer Mother   . Cancer Paternal Grandmother     The following portions of the patient's history were reviewed and updated as appropriate: allergies, current medications, past family history, past medical history, past social history, past surgical history and problem list.  Review of Systems Pertinent items noted in HPI and remainder of comprehensive ROS otherwise negative.  Physical Exam:  BP (!) 98/59   Pulse 83   Ht 5\' 5"  (1.651 m)   Wt 180 lb 9.6 oz (81.9 kg)   LMP  (LMP Unknown)   BMI 30.05 kg/m  CONSTITUTIONAL: Well-developed, well-nourished female in no acute distress.  HENT:  Normocephalic, atraumatic, External right and left ear normal. Oropharynx is clear and moist EYES: Conjunctivae and EOM are normal.  Pupils are equal, round, and reactive to light. No scleral icterus.  NECK: Normal range of motion, supple, no masses.  Normal thyroid.  SKIN: Skin is warm and dry. No rash noted. Not diaphoretic. No erythema. No pallor. MUSCULOSKELETAL: Normal range of motion. No tenderness.  No cyanosis, clubbing, or edema.  2+ distal pulses. NEUROLOGIC: Alert and oriented to person, place, and time. Normal reflexes, muscle tone coordination.  PSYCHIATRIC: Normal mood and affect. Normal behavior. Normal judgment and thought content. CARDIOVASCULAR: Normal heart rate noted, regular rhythm RESPIRATORY: Clear to auscultation bilaterally. Effort and breath sounds normal, no problems with respiration noted. BREASTS: Symmetric in size. No masses, tenderness, skin changes, nipple drainage, or lymphadenopathy bilaterally.  ABDOMEN: Soft, no distention noted.  No tenderness, rebound or guarding.  PELVIC: Normal appearing external genitalia and urethral meatus; normal appearing vaginal mucosa and cervix.  No abnormal discharge noted.  Pap smear not indicated.  Normal uterine size, no other palpable masses, no uterine or adnexal tenderness.  .   Assessment and Plan:    1. Women's annual routine gynecological examination   Pap: not due Mammogram : n/a  Labs: Hep C Refills: ocp Referral:none Discussed self help measures regarding libido , reassurance given common occurrence. She denies pain with intercourse. States she is in "mommy mode" and it hard to get in mood. Discussed use of Addyi-declines at this time.  Routine preventative health maintenance measures emphasized. Please refer to After Visit Summary for other counseling recommendations.      , CNM [facility],  [facility]

## 2020-06-12 LAB — HEPATITIS C ANTIBODY: Hep C Virus Ab: <0.1 s/co ratio (ref 0.0–0.9)

## 2021-03-26 ENCOUNTER — Other Ambulatory Visit: Payer: Self-pay | Admitting: Certified Nurse Midwife

## 2021-04-12 ENCOUNTER — Other Ambulatory Visit: Payer: Self-pay

## 2021-04-12 ENCOUNTER — Ambulatory Visit
Admission: EM | Admit: 2021-04-12 | Discharge: 2021-04-12 | Disposition: A | Discharge status: Home / Self Care | Attending: Emergency Medicine | Admitting: Emergency Medicine

## 2021-04-12 DIAGNOSIS — J0140 Acute pansinusitis, unspecified: Secondary | ICD-10-CM

## 2021-04-12 DIAGNOSIS — Z20822 Contact with and (suspected) exposure to covid-19: Secondary | ICD-10-CM | POA: Diagnosis present

## 2021-04-12 LAB — RESP PANEL BY RT-PCR (FLU A&B, COVID) ARPGX2
Influenza A by PCR: NEGATIVE
Influenza B by PCR: NEGATIVE
SARS Coronavirus 2 by RT PCR: NEGATIVE

## 2021-04-12 MED ORDER — FLUTICASONE PROPIONATE 50 MCG/ACT NA SUSP: 2.0000 | Freq: Every day | NASAL | 0 refills | Status: DC

## 2021-04-12 MED ORDER — IBUPROFEN 600 MG PO TABS: 600.0000 mg | ORAL_TABLET | Freq: Four times a day (QID) | ORAL | 0 refills | Status: DC | PRN

## 2021-04-12 NOTE — ED Provider Notes (Signed)
HPI  SUBJECTIVE:  Daisy Hancock is a 35 y.o. female who presents with 3 days of fevers T-max 101, nasal congestion, maxillary sinus pain and pressure, sore throat, postnasal drip, bilateral ear pain.  She reports body aches, headaches, loss of sense of smell.  No rhinorrhea, upper dental pain, facial swelling, loss of sense of taste, cough, shortness of breath, nausea, vomiting, diarrhea, abdominal pain.  No known COVID or flu exposure.  She got the second dose of the COVID-vaccine.  She did not get this years flu vaccine.  No antibiotics in the past month.  She took ibuprofen 400 mg within 6 hours of evaluation without improvement in her symptoms.  She has been alternating Tylenol 1000 g with the ibuprofen, she has tried Benadryl and Mucinex.  No aggravating factors.  No double sickening.  Past medical history negative for COVID.  She states that she had influenza over Thanksgiving.  LMP: 2 weeks ago.  PMD: None.   Past Medical History:  Diagnosis Date   Anxiety    Depression    Thyroid disease    Vitamin D deficiency     Past Surgical History:  Procedure Laterality Date   NO PAST SURGERIES      Family History  Problem Relation Age of Onset   Heart disease Maternal Grandfather    Colon cancer Maternal Grandfather    Breast cancer Mother    Cancer Paternal Grandmother    Ovarian cancer Neg Hx     Social History   Tobacco Use   Smoking status: Never   Smokeless tobacco: Never  Vaping Use   Vaping Use: Never used  Substance Use Topics   Alcohol use: Yes    Comment: occasionally   Drug use: No    No current facility-administered medications for this encounter.  Current Outpatient Medications:    ALPRAZolam (XANAX) 0.5 MG tablet, Take 0.5 mg by mouth at bedtime as needed for anxiety., Disp: , Rfl:    Cholecalciferol (VITAMIN D) 2000 units tablet, Take 4,000 Units by mouth daily. , Disp: , Rfl:    fluticasone (FLONASE) 50 MCG/ACT nasal spray, Place 2 sprays into both  nostrils daily., Disp: 16 g, Rfl: 0   ibuprofen (ADVIL) 600 MG tablet, Take 1 tablet (600 mg total) by mouth every 6 (six) hours as needed., Disp: 30 tablet, Rfl: 0   levothyroxine (SYNTHROID) 50 MCG tablet, TAKE 1 TABLET(50 MCG) BY MOUTH DAILY BEFORE BREAKFAST, Disp: 90 tablet, Rfl: 3   SPRINTEC 28 0.25-35 MG-MCG tablet, TAKE 1 TABLET BY MOUTH DAILY, Disp: 28 tablet, Rfl: 3  Allergies  Allergen Reactions   Ceclor [Cefaclor]    Prednisone Other (See Comments)    suicidal   Sulfa Antibiotics     GI issue     ROS  As noted in HPI.   Physical Exam  BP 105/79 (BP Location: Left Arm)    Pulse (!) 108    Temp 98.9 F (37.2 C) (Oral)    Resp 18    Ht 5\' 5"  (1.651 m)    Wt 83.9 kg    SpO2 99%    BMI 30.79 kg/m   Constitutional: Well developed, well nourished, no acute distress Eyes:  EOMI, conjunctiva normal bilaterally HENT: Normocephalic, atraumatic,mucus membranes moist.  Erythematous, swollen turbinates.  Clear nasal congestion.  No maxillary, frontal sinus tenderness.  Erythematous oropharynx, tonsils normal size without exudates, uvula midline.  Positive postnasal drip. neck: Positive cervical lymphadenopathy Respiratory: Normal inspiratory effort Cardiovascular: Tachycardia GI: nondistended skin:  No rash, skin intact Musculoskeletal: no deformities Neurologic: Alert & oriented x 3, no focal neuro deficits Psychiatric: Speech and behavior appropriate   ED Course   Medications - No data to display  Orders Placed This Encounter  Procedures   Resp Panel by RT-PCR (Flu A&B, Covid) Nasopharyngeal Swab    Standing Status:   Standing    Number of Occurrences:   1   Airborne and Contact precautions    Standing Status:   Standing    Number of Occurrences:   1    Results for orders placed or performed during the hospital encounter of 04/12/21 (from the past 24 hour(s))  Resp Panel by RT-PCR (Flu A&B, Covid) Nasopharyngeal Swab     Status: None   Collection Time: 04/12/21   8:30 AM   Specimen: Nasopharyngeal Swab; Nasopharyngeal(NP) swabs in vial transport medium  Result Value Ref Range   SARS Coronavirus 2 by RT PCR NEGATIVE NEGATIVE   Influenza A by PCR NEGATIVE NEGATIVE   Influenza B by PCR NEGATIVE NEGATIVE   No results found.  ED Clinical Impression  1. Acute non-recurrent pansinusitis   2. Encounter for laboratory testing for COVID-19 virus      ED Assessment/Plan  [phone]  COVID, flu sent.  Patient with a sinusitis, most likely viral.  Will prescribe Molnupiravir or Tamiflu if testing is positive.  In the meantime, Tylenol/ibuprofen, Flonase, saline nasal irrigation, Mucinex D, primary care list, will order assistance in finding a PMD.  Discussed that she does not yet meet indications for antibiotics, but she return here in 5 days or sooner for double sickening, higher fevers, or if do not resolve after 10 days and we can consider antibiotics at that time.  COVID, flu PCR negative.  Supportive treatment.  Plan as above.  Discussed labs, MDM, treatment plan, and plan for follow-up with patient.  patient agrees with plan.   Meds ordered this encounter  Medications   fluticasone (FLONASE) 50 MCG/ACT nasal spray    Sig: Place 2 sprays into both nostrils daily.    Dispense:  16 g    Refill:  0   ibuprofen (ADVIL) 600 MG tablet    Sig: Take 1 tablet (600 mg total) by mouth every 6 (six) hours as needed.    Dispense:  30 tablet    Refill:  0      *This clinic note was created using Scientist, clinical (histocompatibility and immunogenetics). Therefore, there may be occasional mistakes despite careful proofreading.  ?    Domenick Gong, MD 04/14/21 [phone]

## 2021-04-12 NOTE — ED Triage Notes (Signed)
Pt here with C/O sinus pressure, drainage, pressure in ears for 3 days.

## 2021-04-12 NOTE — Discharge Instructions (Addendum)
I will contact you if and only if your testing comes back positive.  Start Mucinex-D to keep the mucous thin and to decongest you.   You may take 600 mg of motrin with 1000 mg of tylenol up to 3-4 times a day as needed for pain. This is an effective combination for pain.  Most sinus infections are viral and do not need antibiotics unless you have a high fever, have had this for 10 days, or you get better and then get sick again. Use a NeilMed sinus rinse with distilled water as often as you want to to reduce nasal congestion. Follow the directions on the box.   Go to www.goodrx.com to look up your medications. This will give you a list of where you can find your prescriptions at the most affordable prices. Or you can ask the pharmacist what the cash price is. This is frequently cheaper than going through insurance.

## 2021-06-11 ENCOUNTER — Other Ambulatory Visit: Payer: Self-pay | Admitting: Certified Nurse Midwife

## 2021-06-11 ENCOUNTER — Encounter: Admitting: Certified Nurse Midwife

## 2021-07-07 ENCOUNTER — Encounter: Payer: Self-pay | Admitting: Certified Nurse Midwife

## 2021-07-07 ENCOUNTER — Other Ambulatory Visit: Payer: Self-pay | Admitting: Certified Nurse Midwife

## 2021-07-07 MED ORDER — SPRINTEC 28 0.25-35 MG-MCG PO TABS: 1.0000 | ORAL_TABLET | Freq: Every day | ORAL | 3 refills | Status: DC

## 2021-08-26 ENCOUNTER — Encounter: Admitting: Certified Nurse Midwife

## 2021-09-16 ENCOUNTER — Encounter: Payer: Self-pay | Admitting: Certified Nurse Midwife

## 2021-09-29 ENCOUNTER — Telehealth: Payer: Self-pay | Admitting: Certified Nurse Midwife

## 2021-09-29 ENCOUNTER — Telehealth: Payer: Self-pay | Admitting: Obstetrics

## 2021-09-29 ENCOUNTER — Other Ambulatory Visit: Payer: Self-pay | Admitting: Obstetrics

## 2021-09-29 MED ORDER — SPRINTEC 28 0.25-35 MG-MCG PO TABS: 1.0000 | ORAL_TABLET | Freq: Every day | ORAL | 0 refills | Status: DC

## 2021-09-29 NOTE — Telephone Encounter (Signed)
Pt would like a refill on BC until she can be seen at her next physical. Unable to schedule her physical due to no availability at this time. Added pt to wait list and pt will call back in July to see if we have availability for scheduling. Please advise on [facility]

## 2021-09-29 NOTE — Progress Notes (Signed)
Refill of Sprintec sent until pt can schedule annual visit.  M. Chryl Heck CNM

## 2021-12-01 ENCOUNTER — Encounter: Admitting: Certified Nurse Midwife

## 2022-04-26 ENCOUNTER — Other Ambulatory Visit: Payer: Self-pay | Admitting: Certified Nurse Midwife

## 2022-10-07 ENCOUNTER — Other Ambulatory Visit: Payer: Self-pay | Admitting: Obstetrics

## 2022-10-08 ENCOUNTER — Encounter: Payer: Self-pay | Admitting: Certified Nurse Midwife

## 2022-10-09 ENCOUNTER — Other Ambulatory Visit: Payer: Self-pay

## 2022-10-09 DIAGNOSIS — Z3041 Encounter for surveillance of contraceptive pills: Secondary | ICD-10-CM

## 2022-10-09 MED ORDER — SPRINTEC 28 0.25-35 MG-MCG PO TABS: 1.0000 | ORAL_TABLET | Freq: Every day | ORAL | 0 refills | Status: DC

## 2022-10-09 NOTE — Telephone Encounter (Signed)
Spoke with patient medication sent to pharmacy no other question.

## 2022-11-03 ENCOUNTER — Ambulatory Visit (INDEPENDENT_AMBULATORY_CARE_PROVIDER_SITE_OTHER): Admitting: Certified Nurse Midwife

## 2022-11-03 ENCOUNTER — Encounter: Payer: Self-pay | Admitting: Certified Nurse Midwife

## 2022-11-03 VITALS — BP 120/81 | HR 87 | Ht 65.0 in | Wt 179.6 lb

## 2022-11-03 DIAGNOSIS — Z01419 Encounter for gynecological examination (general) (routine) without abnormal findings: Secondary | ICD-10-CM

## 2022-11-03 DIAGNOSIS — Z3041 Encounter for surveillance of contraceptive pills: Secondary | ICD-10-CM

## 2022-11-03 MED ORDER — SPRINTEC 28 0.25-35 MG-MCG PO TABS
1.0000 | ORAL_TABLET | Freq: Every day | ORAL | 3 refills | Status: DC
Start: 2022-11-03 — End: 2023-08-05

## 2022-11-03 NOTE — Patient Instructions (Signed)

## 2022-11-03 NOTE — Progress Notes (Signed)
GYNECOLOGY ANNUAL PREVENTATIVE CARE ENCOUNTER NOTE  History:     Daisy Hancock is a 37 y.o. [phone] female here for a routine annual gynecologic exam.  Current complaints: none for her, pt is tearful .States that her daughter was recently diagnosed with type 1 diabetes.She is feeling overwhelmed.    Denies abnormal vaginal bleeding, discharge, pelvic pain, problems with intercourse or other gynecologic concerns.     Social Relationship: married  Living: with spouse and children Work: stay at home mom Exercise: 5x wk Smoke/Alcohol/drug use: occasional alcohol use.  Gynecologic History No LMP recorded (lmp unknown). (Menstrual status: Oral contraceptives). Contraception: OCP (estrogen/progesterone) Last Pap: 04/30/19. Results were: normal with negative HPV Last mammogram: n/a.    Upstream - 11/03/22 1340       Pregnancy Intention Screening   Does the patient want to become pregnant in the next year? No    Does the patient's partner want to become pregnant in the next year? No    Would the patient like to discuss contraceptive options today? No      Contraception Wrap Up   Current Method Oral Contraceptive    End Method Oral Contraceptive            The pregnancy intention screening data noted above was reviewed. Potential methods of contraception were discussed. The patient elected to proceed with Oral Contraceptive.   Obstetric History OB History  Gravida Para Term Preterm AB Living  4 2 2   2 2   SAB IAB Ectopic Multiple Live Births  2     0 2    # Outcome Date GA Lbr Len/2nd Weight Sex Type Anes PTL Lv  4 Term 12/16/17 [redacted]w[redacted]d / 00:18 9 lb 6.3 oz (4.26 kg) M Vag-Spont Local  LIV  3 Term 03/13/14 [redacted]w[redacted]d  8 lb (3.629 kg) F Vag-Spont   LIV     Complications: GDM (gestational diabetes mellitus)  2 SAB           1 SAB             Past Medical History:  Diagnosis Date   Anxiety    Depression    Thyroid disease    Vitamin D deficiency     Past  Surgical History:  Procedure Laterality Date   NO PAST SURGERIES      Current Outpatient Medications on File Prior to Visit  Medication Sig Dispense Refill   Cholecalciferol (VITAMIN D) 2000 units tablet Take 4,000 Units by mouth daily.      levothyroxine (SYNTHROID) 50 MCG tablet TAKE 1 TABLET(50 MCG) BY MOUTH DAILY BEFORE BREAKFAST 90 tablet 3   liothyronine (CYTOMEL) 5 MCG tablet      loratadine (CLARITIN) 10 MG tablet Take 10 mg by mouth daily.     phentermine (ADIPEX-P) 37.5 MG tablet Take 37.5 mg by mouth daily.     No current facility-administered medications on file prior to visit.    Allergies  Allergen Reactions   Ceclor [Cefaclor]    Prednisone Other (See Comments)    suicidal   Sulfa Antibiotics     GI issue    Social History:  reports that she has never smoked. She has never used smokeless tobacco. She reports current alcohol use. She reports that she does not use drugs.  Family History  Problem Relation Age of Onset   Heart disease Maternal Grandfather    Colon cancer Maternal Grandfather    Breast cancer Mother  Cancer Paternal Grandmother    Ovarian cancer Neg Hx     The following portions of the patient's history were reviewed and updated as appropriate: allergies, current medications, past family history, past medical history, past social history, past surgical history and problem list.  Review of Systems Pertinent items noted in HPI and remainder of comprehensive ROS otherwise negative.  Physical Exam:  BP 120/81   Pulse 87   Ht 5\' 5"  (1.651 m)   Wt 179 lb 9.6 oz (81.5 kg)   LMP  (LMP Unknown)   BMI 29.89 kg/m  CONSTITUTIONAL: Well-developed, well-nourished female in no acute distress.  HENT:  Normocephalic, atraumatic, External right and left ear normal. Oropharynx is clear and moist EYES: Conjunctivae and EOM are normal. Pupils are equal, round, and reactive to light. No scleral icterus.  NECK: Normal range of motion, supple, no masses.   Normal thyroid.  SKIN: Skin is warm and dry. No rash noted. Not diaphoretic. No erythema. No pallor. MUSCULOSKELETAL: Normal range of motion. No tenderness.  No cyanosis, clubbing, or edema.  2+ distal pulses. NEUROLOGIC: Alert and oriented to person, place, and time. Normal reflexes, muscle tone coordination.  PSYCHIATRIC: Normal mood and affect. Normal behavior. Normal judgment and thought content. CARDIOVASCULAR: Normal heart rate noted, regular rhythm RESPIRATORY: Clear to auscultation bilaterally. Effort and breath sounds normal, no problems with respiration noted. BREASTS: Symmetric in size. No masses, tenderness, skin changes, nipple drainage, or lymphadenopathy bilaterally.  ABDOMEN: Soft, no distention noted.  No tenderness, rebound or guarding.  PELVIC: Normal appearing external genitalia and urethral meatus; normal appearing vaginal mucosa and cervix.  No abnormal discharge noted.  Pap smear not due.  Normal uterine size, no other palpable masses, no uterine or adnexal tenderness.  .   Assessment and Plan:    1. Women's annual routine gynecological examination  Pap: not due  Mammogram : n/a  Labs: none Refills: ocp Referral: none  Routine preventative health maintenance measures emphasized. Please refer to After Visit Summary for other counseling recommendations.      Doreene Burke, CNM [facility]  [facility],  [facility]

## 2023-03-13 ENCOUNTER — Encounter: Payer: Self-pay | Admitting: Certified Nurse Midwife

## 2023-08-01 ENCOUNTER — Other Ambulatory Visit: Payer: Self-pay | Admitting: Certified Nurse Midwife

## 2023-08-01 DIAGNOSIS — Z3041 Encounter for surveillance of contraceptive pills: Secondary | ICD-10-CM

## 2023-08-04 ENCOUNTER — Encounter: Payer: Self-pay | Admitting: Certified Nurse Midwife

## 2023-08-05 ENCOUNTER — Other Ambulatory Visit: Payer: Self-pay

## 2023-08-05 DIAGNOSIS — Z3041 Encounter for surveillance of contraceptive pills: Secondary | ICD-10-CM

## 2023-08-05 MED ORDER — SPRINTEC 28 0.25-35 MG-MCG PO TABS
1.0000 | ORAL_TABLET | Freq: Every day | ORAL | 3 refills | Status: AC
Start: 2023-08-05 — End: ?
